# Patient Record
Sex: Female | Born: 2000
Health system: Southern US, Community
[De-identification: ages and names within clinical notes are randomized; demographics above are authoritative.]

## PROBLEM LIST (undated history)

## (undated) DIAGNOSIS — Z789 Other specified health status: Secondary | ICD-10-CM

## (undated) HISTORY — DX: Other specified health status: Z78.9

## (undated) HISTORY — PX: NO PAST SURGERIES: SHX2092

## (undated) HISTORY — PX: DILATION AND CURETTAGE OF UTERUS: SHX78

---
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
## 2021-04-12 ENCOUNTER — Other Ambulatory Visit: Payer: Self-pay | Admitting: Obstetrics and Gynecology

## 2021-04-12 ENCOUNTER — Encounter: Payer: Self-pay | Admitting: Obstetrics and Gynecology

## 2021-04-12 ENCOUNTER — Other Ambulatory Visit (HOSPITAL_COMMUNITY)
Admission: RE | Admit: 2021-04-12 | Discharge: 2021-04-12 | Disposition: A | Discharge status: Home / Self Care | Payer: BC Managed Care – PPO | Source: Ambulatory Visit | Attending: Obstetrics and Gynecology | Admitting: Obstetrics and Gynecology

## 2021-04-12 ENCOUNTER — Ambulatory Visit (INDEPENDENT_AMBULATORY_CARE_PROVIDER_SITE_OTHER): Payer: BC Managed Care – PPO | Admitting: Obstetrics and Gynecology

## 2021-04-12 ENCOUNTER — Other Ambulatory Visit: Payer: Self-pay

## 2021-04-12 VITALS — BP 128/82 | HR 79 | Ht 65.0 in | Wt 158.7 lb

## 2021-04-12 DIAGNOSIS — Z3A32 32 weeks gestation of pregnancy: Secondary | ICD-10-CM | POA: Diagnosis not present

## 2021-04-12 DIAGNOSIS — Z3202 Encounter for pregnancy test, result negative: Secondary | ICD-10-CM

## 2021-04-12 DIAGNOSIS — O0930 Supervision of pregnancy with insufficient antenatal care, unspecified trimester: Secondary | ICD-10-CM

## 2021-04-12 DIAGNOSIS — Z3403 Encounter for supervision of normal first pregnancy, third trimester: Secondary | ICD-10-CM

## 2021-04-12 DIAGNOSIS — O98813 Other maternal infectious and parasitic diseases complicating pregnancy, third trimester: Secondary | ICD-10-CM | POA: Insufficient documentation

## 2021-04-12 DIAGNOSIS — Z658 Other specified problems related to psychosocial circumstances: Secondary | ICD-10-CM | POA: Diagnosis not present

## 2021-04-12 DIAGNOSIS — O163 Unspecified maternal hypertension, third trimester: Secondary | ICD-10-CM

## 2021-04-12 DIAGNOSIS — O0990 Supervision of high risk pregnancy, unspecified, unspecified trimester: Secondary | ICD-10-CM | POA: Insufficient documentation

## 2021-04-12 DIAGNOSIS — Z3480 Encounter for supervision of other normal pregnancy, unspecified trimester: Secondary | ICD-10-CM | POA: Diagnosis not present

## 2021-04-12 DIAGNOSIS — Z3483 Encounter for supervision of other normal pregnancy, third trimester: Secondary | ICD-10-CM | POA: Diagnosis not present

## 2021-04-12 DIAGNOSIS — O09893 Supervision of other high risk pregnancies, third trimester: Secondary | ICD-10-CM

## 2021-04-12 DIAGNOSIS — Z23 Encounter for immunization: Secondary | ICD-10-CM

## 2021-04-12 LAB — POCT PREGNANCY, URINE: Preg Test, Ur: POSITIVE — AB

## 2021-04-12 MED ORDER — BLOOD PRESSURE KIT DEVI: 1.0000 | 0 refills | Status: DC

## 2021-04-12 MED ORDER — PRENATAL VITAMIN 27-0.8 MG PO TABS: 1.0000 | ORAL_TABLET | Freq: Every day | ORAL | 3 refills | Status: DC

## 2021-04-12 NOTE — Progress Notes (Signed)
Anatomy ultrasound has been scheduled for June 23, 22 at 2:30pm @ [facility]   Patient also declined food market and transportation services

## 2021-04-12 NOTE — Progress Notes (Signed)
Patient coplains of lower back pain

## 2021-04-12 NOTE — Progress Notes (Signed)
PRENATAL VISIT NOTE  Subjective:  Tammy Rosales is a 20 y.o. G1P0 at [redacted]w[redacted]d being seen today for initial prenatal visit.  She is currently monitored for the following issues for this low-risk pregnancy and has Supervision of other normal pregnancy, antepartum; Late prenatal care; Psychosocial stressors; Supervision of normal first teen pregnancy in third trimester; Elevated blood pressure affecting pregnancy in third trimester, antepartum; and [redacted] weeks gestation of pregnancy on their problem list.  Patient reports no complaints. However, pt reports feeling "scared about delivery". Contractions: Irritability. Vag. Bleeding: None.  Movement: Present. Denies leaking of fluid.   The following portions of the patient's history were reviewed and updated as appropriate: allergies, current medications, past family history, past medical history, past social history, past surgical history and problem list.   Objective:   Vitals:   04/12/21 1040 04/12/21 1043 04/12/21 1146  BP: 133/90  128/82  Pulse: 80  79  Weight: 158 lb 11.2 oz (72 kg)    Height:  5\' 5"  (1.651 m)     Fetal Status: Fetal Heart Rate (bpm): 135   Movement: Present     General:  Alert, oriented and cooperative. Patient is in no acute distress.  Skin: Skin is warm and dry. No rash noted.   Cardiovascular: Normal heart rate noted  Respiratory: Normal respiratory effort, no problems with respiration noted  Abdomen: Soft, gravid, appropriate for gestational age.  Pain/Pressure: Present     Pelvic: Cervical exam deferred        Extremities: Normal range of motion.  Edema: Trace  Mental Status: Normal mood and affect. Normal behavior. Normal judgment and thought content.   Assessment and Plan:  Pregnancy: G1P0 at [redacted]w[redacted]d by LMP. No prior ultrasounds performed.  1. Supervision of other normal pregnancy, antepartum  [redacted] weeks gestation of pregnancy:  Pt providing minimal responses to questions today. Denies PMH. Denies any current  medications or substance use. Denies history of HSV or other STIs. Denies mood and safety concerns but reports minimal social support ([name] not involved). Discussed plans for feeding, birth control and delivery; pt is currently undecided and had no questions today. Will plan for follow-up phone conversation given pt seems very overwhelmed today. - Tdap administered today - Hemoglobin A1c - Genetic Screening - [facility] AMB BABYSCRIPTS SCHEDULE OPTIMIZATION - [redacted]w[redacted]d MFM OB COMP + 14 WK; Future - CBC/D/Plt+RPR+Rh+ABO+RubIgG... - Cervicovaginal ancillary only( [facility]) - Glucose Tolerance, 2 Hours w/1 Hour; Future (plan to perform at f/u prenatal) - provided strict return precautions for preterm labor, leakage of fluid, decreased fetal movement, vaginal bleeding, other concerns - RTC in 1-2 weeks for Anatomy Scan & follow-up prenatal appt  2. Late prenatal care: First prenatal appointment today. No specific barriers to care reported. - UDS ordered but pt unable to provide urine sample  3. Psychosocial stressors: Pt reports minimal support. [name] not involved. Pt currently lives with her mother and states that her mother is aware of the pregnancy. However, she states that she has "no one" with whom she is able to talk with regarding her situation. Denies mood and safety concerns today. Denies SI, HI and thoughts of self harm. However, concern given that pt is very guarded with minimal engagement in conversation during visit today. - will plan to have extended conversation with pt via phone when calling with prenatal results [facility] referral today (pt informed of referral today)  4. Elevated blood pressure affecting pregnancy in third trimester, antepartum: Initial blood pressure elevated in clinic today, but  normal on repeat check. No concerning signs/symptoms per pt report. No prior diagnosis of hypertension in or outside of pregnancy. - preeclampsia labs ordered today (pt unable to provide enough urine for  UP:C) - provided strict return precautions for signs/symptoms of preeclampsia  Preterm labor symptoms and general obstetric precautions including but not limited to vaginal bleeding, contractions, leaking of fluid and fetal movement were reviewed in detail with the patient. Please refer to After Visit Summary for other counseling recommendations.   Return in about 1 week (around 04/19/2021) for f/u prenatal in 1-2 weeks with any provider in person. Needs gtt at f/u appt..  Future Appointments  Date Time Provider Department Center  04/18/2021  8:50 AM [facility] LAB [facility]  04/21/2021  1:20 PM Federico Flake, MD [facility]  04/21/2021  2:30 PM [facility] NURSE [facility]  04/21/2021  2:45 PM [facility] US6 [facility]   Barclay Lennox, Skipper Cliche, MD OB Fellow, Faculty Practice 04/12/2021 1:46 PM

## 2021-04-12 NOTE — Patient Instructions (Addendum)
Start prenatal vitamin daily

## 2021-04-13 LAB — CBC/D/PLT+RPR+RH+ABO+RUBIGG...
Antibody Screen: NEGATIVE
Basophils Absolute: 0 10*3/uL (ref 0.0–0.2)
Basos: 0 %
EOS (ABSOLUTE): 0 10*3/uL (ref 0.0–0.4)
Eos: 0 %
HCV Ab: <0.1 s/co ratio (ref 0.0–0.9)
HIV Screen 4th Generation wRfx: NONREACTIVE
Hematocrit: 27.1 % — ABNORMAL LOW (ref 34.0–46.6)
Hemoglobin: 7.5 g/dL — ABNORMAL LOW (ref 11.1–15.9)
Hepatitis B Surface Ag: NEGATIVE
Immature Grans (Abs): 0.1 10*3/uL (ref 0.0–0.1)
Immature Granulocytes: 1 %
Lymphocytes Absolute: 1.8 10*3/uL (ref 0.7–3.1)
Lymphs: 25 %
MCH: 16.8 pg — ABNORMAL LOW (ref 26.6–33.0)
MCHC: 27.7 g/dL — ABNORMAL LOW (ref 31.5–35.7)
MCV: 61 fL — ABNORMAL LOW (ref 79–97)
Monocytes Absolute: 0.4 10*3/uL (ref 0.1–0.9)
Monocytes: 6 %
Neutrophils Absolute: 4.8 10*3/uL (ref 1.4–7.0)
Neutrophils: 68 %
Platelets: 202 10*3/uL (ref 150–450)
RBC: 4.46 x10E6/uL (ref 3.77–5.28)
RDW: 22.5 % — ABNORMAL HIGH (ref 11.7–15.4)
RPR Ser Ql: NONREACTIVE
Rh Factor: POSITIVE
Rubella Antibodies, IGG: 12.1 index (ref >0.99)
WBC: 7.2 10*3/uL (ref 3.4–10.8)

## 2021-04-13 LAB — COMPREHENSIVE METABOLIC PANEL
ALT: 6 IU/L (ref 0–32)
AST: 13 IU/L (ref 0–40)
Albumin/Globulin Ratio: 1.3 (ref 1.2–2.2)
Albumin: 4.1 g/dL (ref 3.9–5.0)
Alkaline Phosphatase: 161 IU/L — ABNORMAL HIGH (ref 42–106)
BUN/Creatinine Ratio: 17 (ref 9–23)
BUN: 10 mg/dL (ref 6–20)
Bilirubin Total: 0.2 mg/dL (ref 0.0–1.2)
CO2: 19 mmol/L — ABNORMAL LOW (ref 20–29)
Calcium: 9.1 mg/dL (ref 8.7–10.2)
Chloride: 104 mmol/L (ref 96–106)
Creatinine, Ser: 0.58 mg/dL (ref 0.57–1.00)
Globulin, Total: 3.1 g/dL (ref 1.5–4.5)
Glucose: 75 mg/dL (ref 65–99)
Potassium: 3.8 mmol/L (ref 3.5–5.2)
Sodium: 139 mmol/L (ref 134–144)
Total Protein: 7.2 g/dL (ref 6.0–8.5)
eGFR: 134 mL/min/{1.73_m2} (ref >59)

## 2021-04-13 LAB — PROTEIN / CREATININE RATIO, URINE
Creatinine, Urine: 327.1 mg/dL
Protein, Ur: 155.2 mg/dL
Protein/Creat Ratio: 474 mg/g creat — ABNORMAL HIGH (ref 0–200)

## 2021-04-13 LAB — CERVICOVAGINAL ANCILLARY ONLY
Chlamydia: POSITIVE — AB
Comment: NEGATIVE
Comment: NEGATIVE
Comment: NORMAL
Neisseria Gonorrhea: NEGATIVE
Trichomonas: NEGATIVE

## 2021-04-13 LAB — HCV INTERPRETATION

## 2021-04-13 LAB — HEMOGLOBIN A1C
Est. average glucose Bld gHb Est-mCnc: 103 mg/dL
Hgb A1c MFr Bld: 5.2 % (ref 4.8–5.6)

## 2021-04-14 ENCOUNTER — Telehealth: Payer: Self-pay | Admitting: Obstetrics and Gynecology

## 2021-04-14 DIAGNOSIS — A749 Chlamydial infection, unspecified: Secondary | ICD-10-CM

## 2021-04-14 MED ORDER — AZITHROMYCIN 500 MG PO TABS: 1000.0000 mg | ORAL_TABLET | Freq: Once | ORAL | 0 refills | Status: AC

## 2021-04-14 NOTE — Telephone Encounter (Signed)
Attempted to call pt but unable to leave message given voice mailbox not set up. Sent MyChart message regarding +Chlamydia result. Sent script for azithromycin to preferred pharmacy. Instructed need for partner treatment prior to sexual activity to avoid reinfection.  Sheila Oats, MD OB Fellow, Faculty Practice 04/14/2021 4:49 PM

## 2021-04-15 ENCOUNTER — Encounter: Payer: Self-pay | Admitting: *Deleted

## 2021-04-18 ENCOUNTER — Encounter: Payer: Self-pay | Admitting: Obstetrics and Gynecology

## 2021-04-18 ENCOUNTER — Other Ambulatory Visit: Payer: Self-pay | Admitting: Obstetrics and Gynecology

## 2021-04-18 ENCOUNTER — Inpatient Hospital Stay (HOSPITAL_COMMUNITY)
Admission: AD | Admit: 2021-04-18 | Discharge: 2021-04-19 | Disposition: A | Discharge status: Home / Self Care | Payer: BC Managed Care – PPO | Attending: Obstetrics & Gynecology | Admitting: Obstetrics & Gynecology

## 2021-04-18 ENCOUNTER — Other Ambulatory Visit: Payer: BC Managed Care – PPO

## 2021-04-18 ENCOUNTER — Telehealth: Payer: Self-pay | Admitting: Obstetrics and Gynecology

## 2021-04-18 ENCOUNTER — Other Ambulatory Visit: Payer: Self-pay

## 2021-04-18 DIAGNOSIS — Z3A00 Weeks of gestation of pregnancy not specified: Secondary | ICD-10-CM | POA: Insufficient documentation

## 2021-04-18 DIAGNOSIS — O26899 Other specified pregnancy related conditions, unspecified trimester: Secondary | ICD-10-CM

## 2021-04-18 DIAGNOSIS — A749 Chlamydial infection, unspecified: Secondary | ICD-10-CM | POA: Insufficient documentation

## 2021-04-18 DIAGNOSIS — O99013 Anemia complicating pregnancy, third trimester: Secondary | ICD-10-CM | POA: Insufficient documentation

## 2021-04-18 DIAGNOSIS — R030 Elevated blood-pressure reading, without diagnosis of hypertension: Secondary | ICD-10-CM | POA: Insufficient documentation

## 2021-04-18 DIAGNOSIS — Z3689 Encounter for other specified antenatal screening: Secondary | ICD-10-CM

## 2021-04-18 DIAGNOSIS — O98313 Other infections with a predominantly sexual mode of transmission complicating pregnancy, third trimester: Secondary | ICD-10-CM

## 2021-04-18 DIAGNOSIS — R109 Unspecified abdominal pain: Secondary | ICD-10-CM | POA: Diagnosis not present

## 2021-04-18 DIAGNOSIS — O26893 Other specified pregnancy related conditions, third trimester: Secondary | ICD-10-CM | POA: Diagnosis not present

## 2021-04-18 DIAGNOSIS — Z3480 Encounter for supervision of other normal pregnancy, unspecified trimester: Secondary | ICD-10-CM

## 2021-04-18 DIAGNOSIS — R102 Pelvic and perineal pain: Secondary | ICD-10-CM | POA: Diagnosis not present

## 2021-04-18 DIAGNOSIS — O98813 Other maternal infectious and parasitic diseases complicating pregnancy, third trimester: Secondary | ICD-10-CM | POA: Diagnosis not present

## 2021-04-18 DIAGNOSIS — Z3A33 33 weeks gestation of pregnancy: Secondary | ICD-10-CM

## 2021-04-18 DIAGNOSIS — O1490 Unspecified pre-eclampsia, unspecified trimester: Secondary | ICD-10-CM | POA: Insufficient documentation

## 2021-04-18 MED ORDER — SODIUM CHLORIDE 0.9 % IV SOLN: INTRAVENOUS | Status: DC

## 2021-04-18 MED ORDER — SODIUM CHLORIDE 0.9 % IV SOLN
510.0000 mg | Freq: Once | INTRAVENOUS | Status: AC
  Administered 2021-04-19: 510 mg via INTRAVENOUS
  Filled 2021-04-18: qty 17

## 2021-04-18 NOTE — Telephone Encounter (Signed)
Called Tammy Rosales given new diagnosis of preeclampsia by UP:C 0.47. Tammy Rosales also with Hgb 7.5. When talking with Tammy Rosales she reported new vision changes, dizziness and new vaginal bleeding. Given inability to check blood pressure and concern of vaginal bleeding instructed Tammy Rosales to present to MAU tonight. Tammy Rosales also has been unable to pick up azithro for chlamydia infection diagnosed on 6/14. She is open to receiving IV iron in MAU tonight.  Sheila Oats, MD OB Fellow, Faculty Practice 04/18/2021 9:03 PM

## 2021-04-18 NOTE — Progress Notes (Signed)
IV iron order for anemia in pregnancy. Hgb 7.5 on 6/14.  Sheila Oats, MD OB Fellow, Faculty Practice 04/18/2021 8:38 PM

## 2021-04-18 NOTE — MAU Note (Addendum)
Pt reports she has been having lower back pain for 2 months. C/o creamy vaginal discharge and sharp pain in her lower abd for several  weeks. Pt also stated she saw some blood when she went  to the BR tonight. Went to first appointment last week.  And all they did was take her labs and blood pressure.  She said someone called her tonight from [facility] and told her to come in. ( Looking at note she was told she had possible preeclampsia and needed to come in to be evaluated. ) Pt reports mild headache tonight.

## 2021-04-19 ENCOUNTER — Telehealth: Payer: Self-pay

## 2021-04-19 DIAGNOSIS — O98813 Other maternal infectious and parasitic diseases complicating pregnancy, third trimester: Secondary | ICD-10-CM | POA: Diagnosis not present

## 2021-04-19 LAB — COMPREHENSIVE METABOLIC PANEL
ALT: 9 U/L (ref 0–44)
AST: 18 U/L (ref 15–41)
Albumin: 2.7 g/dL — ABNORMAL LOW (ref 3.5–5.0)
Alkaline Phosphatase: 131 U/L — ABNORMAL HIGH (ref 38–126)
Anion gap: 12 (ref 5–15)
BUN: 9 mg/dL (ref 6–20)
CO2: 19 mmol/L — ABNORMAL LOW (ref 22–32)
Calcium: 9 mg/dL (ref 8.9–10.3)
Chloride: 105 mmol/L (ref 98–111)
Creatinine, Ser: 0.65 mg/dL (ref 0.44–1.00)
GFR, Estimated: >60 mL/min (ref >60)
Glucose, Bld: 94 mg/dL (ref 70–99)
Potassium: 3.8 mmol/L (ref 3.5–5.1)
Sodium: 136 mmol/L (ref 135–145)
Total Bilirubin: 0.4 mg/dL (ref 0.3–1.2)
Total Protein: 6.1 g/dL — ABNORMAL LOW (ref 6.5–8.1)

## 2021-04-19 LAB — CBC
HCT: 24.1 % — ABNORMAL LOW (ref 36.0–46.0)
Hemoglobin: 6.8 g/dL — CL (ref 12.0–15.0)
MCH: 17.3 pg — ABNORMAL LOW (ref 26.0–34.0)
MCHC: 28.2 g/dL — ABNORMAL LOW (ref 30.0–36.0)
MCV: 61.2 fL — ABNORMAL LOW (ref 80.0–100.0)
Platelets: 217 10*3/uL (ref 150–400)
RBC: 3.94 MIL/uL (ref 3.87–5.11)
RDW: 23.2 % — ABNORMAL HIGH (ref 11.5–15.5)
WBC: 6.4 10*3/uL (ref 4.0–10.5)
nRBC: 0.5 % — ABNORMAL HIGH (ref 0.0–0.2)

## 2021-04-19 LAB — PROTEIN / CREATININE RATIO, URINE
Creatinine, Urine: 539.94 mg/dL
Protein Creatinine Ratio: 0.2 mg/mg{Cre} — ABNORMAL HIGH (ref 0.00–0.15)
Total Protein, Urine: 107 mg/dL

## 2021-04-19 MED ORDER — AZITHROMYCIN 250 MG PO TABS
1000.0000 mg | ORAL_TABLET | Freq: Once | ORAL | Status: AC
  Administered 2021-04-19: 1000 mg via ORAL
  Filled 2021-04-19: qty 4

## 2021-04-19 MED ORDER — ONDANSETRON 4 MG PO TBDP
8.0000 mg | ORAL_TABLET | Freq: Once | ORAL | Status: AC
  Administered 2021-04-19: 8 mg via ORAL
  Filled 2021-04-19: qty 2

## 2021-04-19 NOTE — MAU Provider Note (Signed)
Chief Complaint:  Back Pain   Event Date/Time   First Provider Initiated Contact with Patient 04/18/21 2347     HPI: Tammy Rosales is a 20 y.o. G1P0 at 72w1dwho presents to maternity admissions reporting overall concern about her pregnancy and various complaints including lower back pain, fatigue, vaginal spotting just this afternoon, and pain in her lower abdomen mostly when she moves or sneezes. Reports feeling dizzy a lot with a headache she could not describe well other than "I just feel irritated". Could not give headache a pain rated, said it did not feel like pressure. Also of note, pt has an appointment for GTT on Wednesday but thought she had to fast for 48hrs prior so has only had fries today. Reports not eating very much because she is not sure what can hurt the baby. No other physical complaints. MGM at bedside for support.  P[facility] due to pregnancy ambivalence, her parents found out just this past week. Prenatal records reviewed. Per Dr. GMary Sellanote, pt found to be preeclamptic (BP+PC:R) and when pt was called she reported dizziness and vision changes (no headache) and spotting so was sent to MAU for further evaluation. Pt also + for chlamydia but never began taking antibiotics.  No past medical history on file. OB History  Gravida Para Term Preterm AB Living  1            SAB IAB Ectopic Multiple Live Births               # Outcome Date GA Lbr Len/2nd Weight Sex Delivery Anes PTL Lv  1 Current            No past surgical history on file. No family history on file.   No Known Allergies Medications Prior to Admission  Medication Sig Dispense Refill Last Dose   Blood Pressure Monitoring (BLOOD PRESSURE KIT) DEVI 1 each by Does not apply route once a week. 1 each 0    Prenatal Vit-Fe Fumarate-FA (PRENATAL VITAMIN) 27-0.8 MG TABS Take 1 tablet by mouth daily. 90 tablet 3    I have reviewed patient's Past Medical Hx, Surgical Hx, Family  Hx, Social Hx, medications and allergies.   ROS:  Pertinent items noted in HPI and remainder of comprehensive ROS otherwise negative.  Physical Exam  Patient Vitals for the past 24 hrs:  BP Temp Pulse Resp SpO2 Height Weight  04/19/21 0101 129/75 -- 81 -- -- -- --  04/19/21 0032 125/72 -- 85 -- -- -- --  04/19/21 0025 128/73 -- 81 -- -- -- --  04/19/21 0021 -- -- -- -- 99 % -- --  04/19/21 0001 129/88 -- 86 -- 99 % -- --  04/18/21 2331 123/79 -- 78 -- 99 % -- --  04/18/21 2318 (!) 132/94 -- 89 -- -- -- --  04/18/21 2301 137/75 98.3 F (36.8 C) 100 18 -- 5' 5"  (1.651 m) 155 lb (70.3 kg)   Constitutional: Well-developed, well-nourished female in no acute distress but appears anxious.  Cardiovascular: normal rate & rhythm, no murmur Respiratory: normal effort, lung sounds clear throughout GI: Abd soft, non-tender, gravid appropriate for gestational age. Pos BS x 4 MS: Extremities nontender, no edema, normal ROM Neurologic: Alert and oriented x 4.  GU: no CVA tenderness Pelvic: NEFG, copious white milky discharge, no blood but cervix friable, visually closed  Fetal Tracing: reactive Baseline: 140  Variability: moderate Accelerations: 15x15 Decelerations: none Toco: relaxed  Labs: Results for orders placed or performed during the hospital encounter of 04/18/21 (from the past 24 hour(s))  CBC     Status: Abnormal   Collection Time: 04/18/21 11:48 PM  Result Value Ref Range   WBC 6.4 4.0 - 10.5 K/uL   RBC 3.94 3.87 - 5.11 MIL/uL   Hemoglobin 6.8 (LL) 12.0 - 15.0 g/dL   HCT 24.1 (L) 36.0 - 46.0 %   MCV 61.2 (L) 80.0 - 100.0 fL   MCH 17.3 (L) 26.0 - 34.0 pg   MCHC 28.2 (L) 30.0 - 36.0 g/dL   RDW 23.2 (H) 11.5 - 15.5 %   Platelets 217 150 - 400 K/uL   nRBC 0.5 (H) 0.0 - 0.2 %  Comprehensive metabolic panel     Status: Abnormal   Collection Time: 04/18/21 11:48 PM  Result Value Ref Range   Sodium 136 135 - 145 mmol/L   Potassium 3.8 3.5 - 5.1 mmol/L   Chloride 105 98 - 111  mmol/L   CO2 19 (L) 22 - 32 mmol/L   Glucose, Bld 94 70 - 99 mg/dL   BUN 9 6 - 20 mg/dL   Creatinine, Ser 0.65 0.44 - 1.00 mg/dL   Calcium 9.0 8.9 - 10.3 mg/dL   Total Protein 6.1 (L) 6.5 - 8.1 g/dL   Albumin 2.7 (L) 3.5 - 5.0 g/dL   AST 18 15 - 41 U/L   ALT 9 0 - 44 U/L   Alkaline Phosphatase 131 (H) 38 - 126 U/L   Total Bilirubin 0.4 0.3 - 1.2 mg/dL   GFR, Estimated >60 >60 mL/min   Anion gap 12 5 - 15  Protein / creatinine ratio, urine     Status: Abnormal   Collection Time: 04/18/21 11:48 PM  Result Value Ref Range   Creatinine, Urine 539.94 mg/dL   Total Protein, Urine 107 mg/dL   Protein Creatinine Ratio 0.20 (H) 0.00 - 0.15 mg/mg[Cre]   Imaging:  No results found.  MAU Course: Orders Placed This Encounter  Procedures   CBC   Comprehensive metabolic panel   Protein / creatinine ratio, urine   Discharge patient   Meds ordered this encounter  Medications   ferumoxytol (FERAHEME) 510 mg in sodium chloride 0.9 % 100 mL IVPB   0.9 %  sodium chloride infusion   azithromycin (ZITHROMAX) tablet 1,000 mg   ondansetron (ZOFRAN-ODT) disintegrating tablet 8 mg   MDM: Clear barriers to care noted so treatment for chlamydia and critically low iron given in MAU. Premedicated with zofran and given azithromycin, feraheme ordered/given and well-tolerated.  BP and labs improved, never severe range or requiring of labetalol protocol. PC:R improved from last test.  Lengthy discussion with pt and MGM about importance of nutrition and hydration for well-being of both she and baby. Encouraged her to start eating regularly and whatever she can tolerate. Emphasized that the fasting for GTT starts at midnight the night before the test as we do not recommend fasting for any longer than 8-12 hrs while pregnant. Copious reassurance given, pt visibly relieved and more talkative by end of the visit.  Assessment: 1. Supervision of other normal pregnancy, antepartum   2. Elevated blood pressure  reading without diagnosis of hypertension   3. NST (non-stress test) reactive   4. Chlamydia infection complicating pregnancy in third trimester   5. Abdominal cramping affecting pregnancy, antepartum    Plan: Discharge home in stable condition with preterrm/preeclampsia precautions given    Follow-up Information  [facility] [employer] [facility]. Go to.   Specialty: Obstetrics and Gynecology Why: 6/22 at 8:50am for GDM 6/23 at 1:20pm with Dr. Lubertha Sayres, ultrasound will be right after your appointment Contact information: [address] [place] [phone]                Allergies as of 04/19/2021   No Known Allergies      Medication List     TAKE these medications    Blood Pressure Kit Devi 1 each by Does not apply route once a week.   Prenatal Vitamin 27-0.8 MG Tabs Take 1 tablet by mouth daily.       Gaylan Gerold, CNM, MSN, [place] Certified Nurse Midwife, [facility]

## 2021-04-19 NOTE — Telephone Encounter (Addendum)
-----   Message from Sheila Oats, MD sent at 04/18/2021  8:38 PM EDT ----- Regarding: Need for outpatient IV iron appt (fereheme order placed) Please call and schedule IV iron infusion for outpatient setting. I have ordered fereheme and called the pt to inform her of Hgb 7.5. Thanks!   Called short stay to schedule iron infusion appointment 3 times today; unable to reach department. Option to leave a VM; did not leave message. Will call again tomorrow.

## 2021-04-19 NOTE — MAU Note (Signed)
CRITICAL VALUE STICKER  CRITICAL VALUE:Hgb 6.8  RECEIVER (on-site recipient of call):Kathy WilsonRN  DATE & TIME NOTIFIED: 04/19/21 0010  MESSENGER (representative from lab):  MD NOTIFIED: Jamella Walker,CNM  TIME OF NOTIFICATION:0010  RESPONSE:  IV iron already orderd

## 2021-04-20 ENCOUNTER — Other Ambulatory Visit: Payer: BC Managed Care – PPO

## 2021-04-20 ENCOUNTER — Other Ambulatory Visit: Payer: Self-pay

## 2021-04-20 DIAGNOSIS — Z3480 Encounter for supervision of other normal pregnancy, unspecified trimester: Secondary | ICD-10-CM | POA: Diagnosis not present

## 2021-04-21 ENCOUNTER — Encounter: Payer: Self-pay | Admitting: Family Medicine

## 2021-04-21 ENCOUNTER — Encounter: Payer: Self-pay | Admitting: *Deleted

## 2021-04-21 ENCOUNTER — Ambulatory Visit: Payer: BC Managed Care – PPO | Admitting: *Deleted

## 2021-04-21 ENCOUNTER — Ambulatory Visit (INDEPENDENT_AMBULATORY_CARE_PROVIDER_SITE_OTHER): Payer: BC Managed Care – PPO | Admitting: Family Medicine

## 2021-04-21 ENCOUNTER — Ambulatory Visit: Payer: BC Managed Care – PPO | Attending: Obstetrics and Gynecology

## 2021-04-21 VITALS — BP 126/69 | HR 70

## 2021-04-21 VITALS — BP 130/69 | HR 73 | Wt 154.8 lb

## 2021-04-21 DIAGNOSIS — O0930 Supervision of pregnancy with insufficient antenatal care, unspecified trimester: Secondary | ICD-10-CM | POA: Insufficient documentation

## 2021-04-21 DIAGNOSIS — Z658 Other specified problems related to psychosocial circumstances: Secondary | ICD-10-CM

## 2021-04-21 DIAGNOSIS — O0990 Supervision of high risk pregnancy, unspecified, unspecified trimester: Secondary | ICD-10-CM

## 2021-04-21 DIAGNOSIS — Z3480 Encounter for supervision of other normal pregnancy, unspecified trimester: Secondary | ICD-10-CM | POA: Diagnosis not present

## 2021-04-21 DIAGNOSIS — O1493 Unspecified pre-eclampsia, third trimester: Secondary | ICD-10-CM

## 2021-04-21 DIAGNOSIS — K5900 Constipation, unspecified: Secondary | ICD-10-CM

## 2021-04-21 DIAGNOSIS — O99013 Anemia complicating pregnancy, third trimester: Secondary | ICD-10-CM

## 2021-04-21 LAB — GLUCOSE TOLERANCE, 2 HOURS W/ 1HR
Glucose, 1 hour: 75 mg/dL (ref 65–179)
Glucose, 2 hour: 93 mg/dL (ref 65–152)
Glucose, Fasting: 69 mg/dL (ref 65–91)

## 2021-04-21 MED ORDER — POLYETHYLENE GLYCOL 3350 17 GM/SCOOP PO POWD: 1.0000 | Freq: Two times a day (BID) | ORAL | 1 refills | Status: DC

## 2021-04-21 NOTE — Patient Instructions (Signed)
Blood Builder ([employer])-- this is to increase you iron and prevent anemia  Get at whole food, online  Start Miralax constipation

## 2021-04-21 NOTE — Progress Notes (Signed)
Pt states a lot of back pain when standing, has not been taking any Meds. Advised ok to take Tylenol for pain. Pt verbalized understanding.

## 2021-04-21 NOTE — Progress Notes (Signed)
   PRENATAL VISIT NOTE  Subjective:  Tammy Rosales is a 20 y.o. G1P0 at [redacted]w[redacted]d being seen today for ongoing prenatal care.  She is currently monitored for the following issues for this high-risk pregnancy and has Supervision of other normal pregnancy, antepartum; Late prenatal care; Psychosocial stressors; Elevated blood pressure affecting pregnancy in third trimester, antepartum; [redacted] weeks gestation of pregnancy; Preeclampsia; and Anemia in pregnancy, third trimester on their problem list.  Patient reports backache- located on the left with change in position. Patient reports she has not tried tylenol.  Contractions: Not present. Vag. Bleeding: None.  Movement: Present. Denies leaking of fluid.   The following portions of the patient's history were reviewed and updated as appropriate: allergies, current medications, past family history, past medical history, past social history, past surgical history and problem list.   Objective:   Vitals:   04/21/21 1331  BP: 130/69  Pulse: 73  Weight: 154 lb 12.8 oz (70.2 kg)    Fetal Status: Fetal Heart Rate (bpm): 159   Movement: Present     General:  Alert, oriented and cooperative. Patient is in no acute distress.  Skin: Skin is warm and dry. No rash noted.   Cardiovascular: Normal heart rate noted  Respiratory: Normal respiratory effort, no problems with respiration noted  Abdomen: Soft, gravid, appropriate for gestational age.  Pain/Pressure: Present     Pelvic: Cervical exam deferred        Extremities: Normal range of motion.  Edema: Trace  Mental Status: Normal mood and affect. Normal behavior. Normal judgment and thought content.   Assessment and Plan:  Pregnancy: G1P0 at [redacted]w[redacted]d 1. Anemia in pregnancy, third trimester Hgb 6.8 -- received Fe infusion in MAU Needs additional and has one scheduled 6.28 Recheck in 3 weeks  2. Supervision of other normal pregnancy, antepartum Will get [place] today for anatomy scan CT was positive 6/14,  treated and will need TOC in early July Discussed constipation at length, has hard/pebble stools maybe every 2 days  - Miralax 1 cap BID - increase water/fluids  3. Late prenatal care  4. Preeclampsia - BPP today with MFM - weekly BPP for PEC - Discussed delivery at 37 weeks given dx - Recommend weekly labs   Preterm labor symptoms and general obstetric precautions including but not limited to vaginal bleeding, contractions, leaking of fluid and fetal movement were reviewed in detail with the patient. Please refer to After Visit Summary for other counseling recommendations.   Return in about 2 weeks (around 05/05/2021) for Routine prenatal care, in person- with Edd Arbour or Karyl Kinnier please.  Future Appointments  Date Time Provider Department Center  04/21/2021  2:30 PM [facility] NURSE [facility]  04/21/2021  2:45 PM [facility] US6 [facility]  04/26/2021  1:00 PM [facility] None    Federico Flake, MD

## 2021-04-21 NOTE — Telephone Encounter (Signed)
I called and scheduled for fereheme for 04/26/21 at 1:00. I called [name] and informed her of need for another fereheme infusion and date of appointment. She voices understanding. I also sent message to Dr. Barb Merino to clarify if needs 1 or 2 infusions since she had 1 in MAU already on 04/18/21. Cottrell Gentles,RN

## 2021-04-22 ENCOUNTER — Other Ambulatory Visit: Payer: Self-pay | Admitting: *Deleted

## 2021-04-22 DIAGNOSIS — O133 Gestational [pregnancy-induced] hypertension without significant proteinuria, third trimester: Secondary | ICD-10-CM

## 2021-04-26 ENCOUNTER — Inpatient Hospital Stay (HOSPITAL_COMMUNITY): Admission: RE | Admit: 2021-04-26 | Payer: BC Managed Care – PPO | Source: Ambulatory Visit

## 2021-04-27 ENCOUNTER — Encounter: Payer: Self-pay | Admitting: *Deleted

## 2021-04-28 ENCOUNTER — Other Ambulatory Visit: Payer: Self-pay

## 2021-04-28 ENCOUNTER — Inpatient Hospital Stay (HOSPITAL_COMMUNITY)
Admission: AD | Admit: 2021-04-28 | Discharge: 2021-04-28 | Disposition: A | Discharge status: Home / Self Care | Payer: BC Managed Care – PPO | Attending: Obstetrics & Gynecology | Admitting: Obstetrics & Gynecology

## 2021-04-28 ENCOUNTER — Ambulatory Visit: Payer: BC Managed Care – PPO | Admitting: *Deleted

## 2021-04-28 ENCOUNTER — Encounter (HOSPITAL_COMMUNITY): Payer: Self-pay | Admitting: Obstetrics & Gynecology

## 2021-04-28 ENCOUNTER — Ambulatory Visit: Payer: Self-pay

## 2021-04-28 DIAGNOSIS — O1205 Gestational edema, complicating the puerperium: Secondary | ICD-10-CM | POA: Insufficient documentation

## 2021-04-28 DIAGNOSIS — O99893 Other specified diseases and conditions complicating puerperium: Secondary | ICD-10-CM | POA: Diagnosis not present

## 2021-04-28 DIAGNOSIS — R600 Localized edema: Secondary | ICD-10-CM

## 2021-04-28 DIAGNOSIS — K5900 Constipation, unspecified: Secondary | ICD-10-CM

## 2021-04-28 DIAGNOSIS — O9963 Diseases of the digestive system complicating the puerperium: Secondary | ICD-10-CM | POA: Diagnosis not present

## 2021-04-28 DIAGNOSIS — O1493 Unspecified pre-eclampsia, third trimester: Secondary | ICD-10-CM

## 2021-04-28 DIAGNOSIS — M545 Low back pain, unspecified: Secondary | ICD-10-CM | POA: Diagnosis not present

## 2021-04-28 DIAGNOSIS — F411 Generalized anxiety disorder: Secondary | ICD-10-CM

## 2021-04-28 MED ORDER — CYCLOBENZAPRINE HCL 5 MG PO TABS: 5.0000 mg | ORAL_TABLET | Freq: Two times a day (BID) | ORAL | 0 refills | Status: DC | PRN

## 2021-04-28 MED ORDER — POLYETHYLENE GLYCOL 3350 17 GM/SCOOP PO POWD: 17.0000 g | Freq: Every day | ORAL | 1 refills | Status: DC | PRN

## 2021-04-28 MED ORDER — FUROSEMIDE 20 MG PO TABS: 20.0000 mg | ORAL_TABLET | Freq: Every day | ORAL | 0 refills | Status: DC

## 2021-04-28 NOTE — MAU Note (Signed)
Presents with c/o back pain, constipation, and swelling.  States had vaginal delivery on June 26th @ [facility].  States received [facility] at Center for [employer] but was out of town when labor began.

## 2021-04-28 NOTE — MAU Provider Note (Signed)
History     [id number]  Arrival date and time: 04/28/21 1500    Chief Complaint  Patient presents with   Constipation   Back Pain     HPI Tammy Rosales is a 20 y.o. s/p NSVD on 04/24/2021 who presents for multiple issues.   Patient reports NSVD five days prior while she was out of town for a funeral at [facility] in [place], [place] Reportedly had rapid labor w no time for epidural, vaginal delivery c/b PPH  Patient interviewed with her mother who is also at bedside and her baby Reports she presented for several issues Her legs have been swollen since delivery Both sides equally swollen No personal or family hx of blood clots She is not a smoker Also notes bilateral low back pain, not radiating Also notes constipation, has not had BM in 2 days, has not taken anything for it      OB History     Gravida  1   Para  1   Term  1   Preterm  0   AB      Living  1      SAB      IAB      Ectopic      Multiple      Live Births  1           History reviewed. No pertinent past medical history.  Past Surgical History:  Procedure Laterality Date   DILATION AND CURETTAGE OF UTERUS      History reviewed. No pertinent family history.  Social History   Socioeconomic History   Marital status: Single    Spouse name: Not on file   Number of children: Not on file   Years of education: Not on file   Highest education level: Not on file  Occupational History   Not on file  Tobacco Use   Smoking status: Never   Smokeless tobacco: Never  Substance and Sexual Activity   Alcohol use: Not Currently   Drug use: Not Currently   Sexual activity: Not Currently  Other Topics Concern   Not on file  Social History Narrative   Not on file   Social Determinants of Health   Financial Resource Strain: Not on file  Food Insecurity: Not on file  Transportation Needs: Not on file  Physical Activity: Not on file  Stress: Not on file  Social  Connections: Not on file  Intimate Partner Violence: Not on file    Not on File  No current facility-administered medications on file prior to encounter.   No current outpatient medications on file prior to encounter.     ROS Pertinent positives and negative per HPI, all others reviewed and negative  Physical Exam   BP 129/83 (BP Location: Right Arm)   Pulse 79   Temp 98.1 F (36.7 C) (Oral)   Resp 20   Ht 5\' 5"  (1.651 m)   Wt 66.6 kg   SpO2 100%   BMI 24.43 kg/m   Patient Vitals for the past 24 hrs:  BP Temp Temp src Pulse Resp SpO2 Height Weight  04/28/21 1657 129/83 98.1 F (36.7 C) Oral 79 20 100 % -- --  04/28/21 1646 -- -- -- -- -- -- 5\' 5"  (1.651 m) 66.6 kg    Physical Exam Vitals reviewed.  Constitutional:      General: She is not in acute distress.    Appearance: She is well-developed. She is not diaphoretic.  Eyes:     General: No scleral icterus. Pulmonary:     Effort: Pulmonary effort is normal. No respiratory distress.  Abdominal:     General: There is no distension.     Palpations: Abdomen is soft.     Tenderness: There is no abdominal tenderness. There is no guarding or rebound.  Musculoskeletal:     Comments: Mild non pitting bilateral LE edema in ankles  Skin:    General: Skin is warm and dry.  Neurological:     Mental Status: She is alert.     Coordination: Coordination normal.     Cervical Exam    Bedside Ultrasound Not done  My interpretation: n/a  FHT N/a  Labs No results found for this or any previous visit (from the past 24 hour(s)).  Imaging No results found.  MAU Course  Procedures Lab Orders  No laboratory test(s) ordered today   Meds ordered this encounter  Medications   furosemide (LASIX) 20 MG tablet    Sig: Take 1 tablet (20 mg total) by mouth daily for 3 days.    Dispense:  3 tablet    Refill:  0   cyclobenzaprine (FLEXERIL) 5 MG tablet    Sig: Take 1 tablet (5 mg total) by mouth 2 (two) times daily  as needed for muscle spasms.    Dispense:  20 tablet    Refill:  0   polyethylene glycol powder (GLYCOLAX/MIRALAX) 17 GM/SCOOP powder    Sig: Take 17 g by mouth daily as needed.    Dispense:  510 g    Refill:  1   Imaging Orders  No imaging studies ordered today    MDM moderate  Assessment and Plan  #LE edema Normal PP edema. Nothing on history or exam concerning for DVT. Offered short course of lasix which she accepts.   #Low back pain No red flags. Likely secondary to new activities of motherhood. Trial flexeril.  #Constipation  Offered enema, declines. Counseled on how to use miralax, rx sent to her pharmacy.   #Scant prenatal care Message sent to [facility] to schedule PP visit. Not currently on birth control, referred to bedsider and encouraged her to explore a method prior to upcoming visit.   Discharged to home in stable condition.  Venora Maples, MD/MPH 04/28/21 8:12 PM  Allergies as of 04/28/2021   Not on File      Medication List     TAKE these medications    cyclobenzaprine 5 MG tablet Commonly known as: FLEXERIL Take 1 tablet (5 mg total) by mouth 2 (two) times daily as needed for muscle spasms.   furosemide 20 MG tablet Commonly known as: LASIX Take 1 tablet (20 mg total) by mouth daily for 3 days.   polyethylene glycol powder 17 GM/SCOOP powder Commonly known as: GLYCOLAX/MIRALAX Take 17 g by mouth daily as needed.

## 2021-04-29 ENCOUNTER — Encounter: Payer: Self-pay | Admitting: *Deleted

## 2021-04-29 NOTE — Progress Notes (Signed)
Pt presented for scheduled NST/BPP appt on 6/30. Upon entering exam room, pt stated that she had delivered her baby on 6/26 @ [facility]. When pt was asked shy she came in for appt today, she became tearful and stated that she didn't know what to do and how to proceed with her care. She said that "things have been so messed up" during her pregnancy such as getting prenatal care late and then having the baby out of town when she was there for her grandmother's funeral. I provided emotional support and explained post partum care. Throughout our conversation regarding next steps in her healthcare, pt continued to be tearful. I offered [facility] appt and she agreed - will schedule upon check-out. Pt will also schedule PP check up appt. Pt voiced understanding of all information discussed.

## 2021-05-03 ENCOUNTER — Ambulatory Visit (INDEPENDENT_AMBULATORY_CARE_PROVIDER_SITE_OTHER): Payer: BC Managed Care – PPO | Admitting: Clinical

## 2021-05-03 DIAGNOSIS — F4321 Adjustment disorder with depressed mood: Secondary | ICD-10-CM

## 2021-05-03 DIAGNOSIS — F4329 Adjustment disorder with other symptoms: Secondary | ICD-10-CM | POA: Diagnosis not present

## 2021-05-03 NOTE — BH Specialist Note (Signed)
Integrated Behavioral Health via Telemedicine Visit  05/03/2021 Willodean Leven [id number]  Number of Integrated Behavioral Health visits: 1 Session Start time: 9:15  Session End time: 9:35 Total time: 20  Referring Provider: Edd Arbour, CNM Patient/Family location: Home [facility] Provider location: Center for [facility] at [facility]  All persons participating in visit: Patient Linzie Collin and [facility] Ruwayda Curet   Types of Service: Individual psychotherapy and Video visit  I connected with Irving Burton and/or [name] Serena's  n/a  via  Telephone or Video Enabled Telemedicine Application  (Video is Caregility application) and verified that I am speaking with the correct person using two identifiers. Discussed confidentiality: Yes   I discussed the limitations of telemedicine and the availability of in person appointments.  Discussed there is a possibility of technology failure and discussed alternative modes of communication if that failure occurs.  I discussed that engaging in this telemedicine visit, they consent to the provision of behavioral healthcare and the services will be billed under their insurance.  Patient and/or legal guardian expressed understanding and consented to Telemedicine visit: Yes   Presenting Concerns: Patient and/or family reports the following symptoms/concerns: Pt states primary concern today "not able to think", exhaustion postpartum, poor appetite,  lack of quality sleep, mourning loss of great grandmother while adjusting to new motherhood; family supportive at home.  Duration of problem: postpartum; Severity of problem:  undetermined  Patient and/or Family's Strengths/Protective Factors: Social connections and [place] supports in place (healthy food, safe environments, etc.)  Goals Addressed: Patient will:  Reduce symptoms of: anxiety, depression, and stress   Increase knowledge and/or ability of: healthy habits    Demonstrate ability to: Increase healthy adjustment to current life circumstances, Increase adequate support systems for patient/family, and Improve medication compliance  Progress towards Goals: Ongoing  Interventions: Interventions utilized:  Psychoeducation and/or Health Education and Link to [employer] Standardized Assessments completed:  Not given today  Patient and/or Family Response: Pt agrees with treatment plan  Assessment: Patient currently experiencing Adjustment disorder with other symptoms and Grief  Patient may benefit from psychoeducation and brief therapeutic interventions regarding coping with symptoms of grief and adjustment  .  Plan: Follow up with behavioral health clinician on : Two weeks Behavioral recommendations:  -Begin taking prenatal vitamins, as prescribed by medical provider -Continue to sleep when baby sleeps; accept help from family to be able to get needed rest -Consider new mom support groups online at [email or web address] (Mom Talk, breastfeeding support) Referral(s): Integrated Art gallery manager (In Clinic) and [employer] Resources:  New mom support  I discussed the assessment and treatment plan with the patient and/or parent/guardian. They were provided an opportunity to ask questions and all were answered. They agreed with the plan and demonstrated an understanding of the instructions.   They were advised to call back or seek an in-person evaluation if the symptoms worsen or if the condition fails to improve as anticipated.  Rae Lips, LCSW  Depression screen [facility] 2/9 04/21/2021 04/12/2021  Decreased Interest 0 3  Down, Depressed, Hopeless 0 3  PHQ - 2 Score 0 6  Altered sleeping 2 3  Tired, decreased energy 0 3  Change in appetite 3 3  Feeling bad or failure about yourself  0 0  Trouble concentrating 0 0  Moving slowly or fidgety/restless 0 0  Suicidal thoughts 0 0  PHQ-9 Score 5 15   GAD 7 : Generalized Anxiety  Score 04/21/2021 04/12/2021  Nervous, Anxious, on Edge 0 3  Control/stop worrying 0 3  Worry too much - different things 0 3  Trouble relaxing 0 3  Restless 0 3  Easily annoyed or irritable 0 3  Afraid - awful might happen 0 3  Total GAD 7 Score 0 21

## 2021-05-03 NOTE — Patient Instructions (Signed)
Center for [facility] Healthcare at [facility] [address] [place], [place] [address] [phone] (main office) [phone] [facility] office)  [email or web address] (new mom support at Mom Talk, breastfeeding support online groups)

## 2021-05-04 NOTE — BH Specialist Note (Signed)
Pt did not arrive to video visit and did not answer the phone; unable to leave message as voicemail is not set up; left MyChart message for patient.

## 2021-05-09 ENCOUNTER — Encounter: Payer: BC Managed Care – PPO | Admitting: Medical

## 2021-05-09 ENCOUNTER — Other Ambulatory Visit: Payer: BC Managed Care – PPO

## 2021-05-10 ENCOUNTER — Encounter: Payer: BC Managed Care – PPO | Admitting: Family Medicine

## 2021-05-12 ENCOUNTER — Encounter: Payer: Self-pay | Admitting: *Deleted

## 2021-05-13 ENCOUNTER — Ambulatory Visit: Payer: BC Managed Care – PPO

## 2021-05-17 ENCOUNTER — Ambulatory Visit: Payer: Medicaid Other | Admitting: Clinical

## 2021-05-17 DIAGNOSIS — Z91199 Patient's noncompliance with other medical treatment and regimen due to unspecified reason: Secondary | ICD-10-CM

## 2021-05-17 DIAGNOSIS — Z5329 Procedure and treatment not carried out because of patient's decision for other reasons: Secondary | ICD-10-CM

## 2021-05-19 DIAGNOSIS — D563 Thalassemia minor: Secondary | ICD-10-CM | POA: Insufficient documentation

## 2021-06-06 ENCOUNTER — Other Ambulatory Visit: Payer: Self-pay

## 2021-06-06 ENCOUNTER — Encounter: Payer: Self-pay | Admitting: Obstetrics and Gynecology

## 2021-06-06 ENCOUNTER — Ambulatory Visit (INDEPENDENT_AMBULATORY_CARE_PROVIDER_SITE_OTHER): Payer: BC Managed Care – PPO | Admitting: Obstetrics and Gynecology

## 2021-06-06 VITALS — BP 109/67 | HR 70 | Ht 65.0 in | Wt 137.6 lb

## 2021-06-06 DIAGNOSIS — Z309 Encounter for contraceptive management, unspecified: Secondary | ICD-10-CM | POA: Insufficient documentation

## 2021-06-06 DIAGNOSIS — Z8751 Personal history of pre-term labor: Secondary | ICD-10-CM | POA: Insufficient documentation

## 2021-06-06 DIAGNOSIS — Z30011 Encounter for initial prescription of contraceptive pills: Secondary | ICD-10-CM

## 2021-06-06 DIAGNOSIS — F4329 Adjustment disorder with other symptoms: Secondary | ICD-10-CM | POA: Diagnosis not present

## 2021-06-06 DIAGNOSIS — F4321 Adjustment disorder with depressed mood: Secondary | ICD-10-CM | POA: Diagnosis not present

## 2021-06-06 HISTORY — DX: Personal history of pre-term labor: Z87.51

## 2021-06-06 MED ORDER — NORETHINDRONE 0.35 MG PO TABS: 1.0000 | ORAL_TABLET | Freq: Every day | ORAL | 11 refills | Status: DC

## 2021-06-06 NOTE — Progress Notes (Signed)
    Post Partum Visit Note  Tammy Rosales is a 20 y.o. G12P1001 female who presents for a postpartum visit. She is 6 weeks postpartum following a normal spontaneous vaginal delivery.  I have fully reviewed the prenatal and intrapartum course. The delivery was at 33.6 gestational weeks.  Anesthesia: none. Postpartum course has been unremarkable. Baby is doing well. Baby is feeding by breast. Bleeding staining only. Bowel function is normal. Bladder function is normal. Patient is not sexually active. Contraception method is oral progesterone-only contraceptive. Postpartum depression screening: positive.   The pregnancy intention screening data noted above was reviewed. Potential methods of contraception were discussed. The patient elected to proceed with No data recorded.    Health Maintenance Due  Topic Date Due   COVID-19 Vaccine (1) Never done   HPV VACCINES (1 - 2-dose series) Never done   INFLUENZA VACCINE  05/30/2021    Medical record   Review of Systems Pertinent items noted in HPI and remainder of comprehensive ROS otherwise negative.  Objective:  LMP 08/30/2020 (Approximate)    General:  alert   Breasts:  N/A  Lungs: clear to auscultation bilaterally  Heart:  regular rate and rhythm, S1, S2 normal, no murmur, click, rub or gallop  Abdomen: soft, non-tender; bowel sounds normal; no masses,  no organomegaly   Wound NA  GU exam:  not indicated       Assessment:    There are no diagnoses linked to this encounter.  Nl postpartum exam.  Elevated [place] depression  Plan:   Essential components of care per ACOG recommendations:  1.  Mood and well being: Patient with positive depression screening today. Reviewed local resources for support. Referred to [facility] - Patient tobacco use? No.   - hx of drug use? No.    2. Infant care and feeding:  -Patient currently breastmilk feeding? Yes, no problems.  -Social determinants of health (SDOH) reviewed in EPIC. No  concerns  3. Sexuality, contraception and birth spacing - Patient does not want a pregnancy in the next year.  Desired family size is uncertain. c - Reviewed forms of contraception in tiered fashion. Patient desired oral progesterone-only contraceptive today.   - Discussed birth spacing of 18 months  4. Sleep and fatigue -Encouraged family/partner/community support of 4 hrs of uninterrupted sleep to help with mood and fatigue  5. Physical Recovery  - Discussed patients delivery and complications. She describes her labor as good. - Patient had a Vaginal, no problems at delivery. Patient had a  no  laceration. Perineal healing reviewed. Patient expressed understanding - Patient has urinary incontinence? No. - Patient is safe to resume physical and sexual activity  6.  Health Maintenance - HM due items addressed Yes - Last pap smear No results found for: DIAGPAP Pap smear not done at today's visit.  -Breast Cancer screening indicated? No.   7. Chronic Disease/Pregnancy Condition follow up: None  - PCP follow up  Nettie Elm, MD Center for [facility], [facility]

## 2021-06-06 NOTE — Patient Instructions (Signed)
Health Maintenance, Female Adopting a healthy lifestyle and getting preventive care are important in promoting health and wellness. Ask your health care provider about: The right schedule for you to have regular tests and exams. Things you can do on your own to prevent diseases and keep yourself healthy. What should I know about diet, weight, and exercise? Eat a healthy diet  Eat a diet that includes plenty of vegetables, fruits, low-fat dairy products, and lean protein. Do not eat a lot of foods that are high in solid fats, added sugars, or sodium.  Maintain a healthy weight Body mass index (BMI) is used to identify weight problems. It estimates body fat based on height and weight. Your health care provider can help determineyour BMI and help you achieve or maintain a healthy weight. Get regular exercise Get regular exercise. This is one of the most important things you can do for your health. Most adults should: Exercise for at least 150 minutes each week. The exercise should increase your heart rate and make you sweat (moderate-intensity exercise). Do strengthening exercises at least twice a week. This is in addition to the moderate-intensity exercise. Spend less time sitting. Even light physical activity can be beneficial. Watch cholesterol and blood lipids Have your blood tested for lipids and cholesterol at 20 years of age, then havethis test every 5 years. Have your cholesterol levels checked more often if: Your lipid or cholesterol levels are high. You are older than 20 years of age. You are at high risk for heart disease. What should I know about cancer screening? Depending on your health history and family history, you may need to have cancer screening at various ages. This may include screening for: Breast cancer. Cervical cancer. Colorectal cancer. Skin cancer. Lung cancer. What should I know about heart disease, diabetes, and high blood pressure? Blood pressure and heart  disease High blood pressure causes heart disease and increases the risk of stroke. This is more likely to develop in people who have high blood pressure readings, are of African descent, or are overweight. Have your blood pressure checked: Every 3-5 years if you are 18-39 years of age. Every year if you are 40 years old or older. Diabetes Have regular diabetes screenings. This checks your fasting blood sugar level. Have the screening done: Once every three years after age 40 if you are at a normal weight and have a low risk for diabetes. More often and at a younger age if you are overweight or have a high risk for diabetes. What should I know about preventing infection? Hepatitis B If you have a higher risk for hepatitis B, you should be screened for this virus. Talk with your health care provider to find out if you are at risk forhepatitis B infection. Hepatitis C Testing is recommended for: Everyone born from 1945 through 1965. Anyone with known risk factors for hepatitis C. Sexually transmitted infections (STIs) Get screened for STIs, including gonorrhea and chlamydia, if: You are sexually active and are younger than 20 years of age. You are older than 20 years of age and your health care provider tells you that you are at risk for this type of infection. Your sexual activity has changed since you were last screened, and you are at increased risk for chlamydia or gonorrhea. Ask your health care provider if you are at risk. Ask your health care provider about whether you are at high risk for HIV. Your health care provider may recommend a prescription medicine to help   prevent HIV infection. If you choose to take medicine to prevent HIV, you should first get tested for HIV. You should then be tested every 3 months for as long as you are taking the medicine. Pregnancy If you are about to stop having your period (premenopausal) and you may become pregnant, seek counseling before you get  pregnant. Take 400 to 800 micrograms (mcg) of folic acid every day if you become pregnant. Ask for birth control (contraception) if you want to prevent pregnancy. Osteoporosis and menopause Osteoporosis is a disease in which the bones lose minerals and strength with aging. This can result in bone fractures. If you are 65 years old or older, or if you are at risk for osteoporosis and fractures, ask your health care provider if you should: Be screened for bone loss. Take a calcium or vitamin D supplement to lower your risk of fractures. Be given hormone replacement therapy (HRT) to treat symptoms of menopause. Follow these instructions at home: Lifestyle Do not use any products that contain nicotine or tobacco, such as cigarettes, e-cigarettes, and chewing tobacco. If you need help quitting, ask your health care provider. Do not use street drugs. Do not share needles. Ask your health care provider for help if you need support or information about quitting drugs. Alcohol use Do not drink alcohol if: Your health care provider tells you not to drink. You are pregnant, may be pregnant, or are planning to become pregnant. If you drink alcohol: Limit how much you use to 0-1 drink a day. Limit intake if you are breastfeeding. Be aware of how much alcohol is in your drink. In the [place] one drink equals one 12 oz bottle of beer (355 mL), one 5 oz glass of wine (148 mL), or one 1 oz glass of hard liquor (44 mL). General instructions Schedule regular health, dental, and eye exams. Stay current with your vaccines. Tell your health care provider if: You often feel depressed. You have ever been abused or do not feel safe at home. Summary Adopting a healthy lifestyle and getting preventive care are important in promoting health and wellness. Follow your health care provider's instructions about healthy diet, exercising, and getting tested or screened for diseases. Follow your health care provider's  instructions on monitoring your cholesterol and blood pressure. This information is not intended to replace advice given to you by your health care provider. Make sure you discuss any questions you have with your healthcare provider. Document Revised: 10/09/2018 Document Reviewed: 10/09/2018 Elsevier Patient Education  2022 Elsevier Inc.  

## 2021-10-31 ENCOUNTER — Ambulatory Visit (HOSPITAL_COMMUNITY)
Admission: EM | Admit: 2021-10-31 | Discharge: 2021-10-31 | Disposition: A | Discharge status: Home / Self Care | Payer: BC Managed Care – PPO

## 2021-10-31 DIAGNOSIS — F4320 Adjustment disorder, unspecified: Secondary | ICD-10-CM | POA: Diagnosis not present

## 2021-10-31 NOTE — Discharge Instructions (Signed)

## 2021-10-31 NOTE — Progress Notes (Signed)
°   10/31/21 1120  [place] (Walk-ins at [facility] only)  How Did You Hear About [place]? Family/Friend  What Is the Reason for Your Visit/Call Today? Patient presents with mother for assessment.  Patient did not want to present today,howver states "I just got in the car."  She denies any concerns, outside of sleep issues.  She preferred that her mother not come back for the triage assessment.  She also expressed that she would prefer we not speak with her mother about her concerns.  Patient denies SI, HI, AVH or SA concerns. Patient feels she is "okay" and does not need support/treatment at this time.  She is aware of resources, should he need to seek treatment at some point.  How Long Has This Been Causing You Problems? 1-6 months  Have You Recently Had Any Thoughts About Hurting Yourself? No  Are You Planning to Commit Suicide/Harm Yourself At This time? No  Have you Recently Had Thoughts About [place]? No  Are You Planning To Harm Someone At This Time? No  Are you currently experiencing any auditory, visual or other hallucinations? No  Have You Used Any Alcohol or Drugs in the Past 24 Hours? No  Do you have any current medical co-morbidities that require immediate attention? No  Clinician description of patient physical appearance/behavior: Patient presents with flat affect, minimally engaged and clearly does not want to be be here.  What Do You Feel Would Help You the Most Today? Treatment for Depression or other mood problem  If access to [facility] was not available, would you have sought care in the Emergency Department? No  Determination of Need Routine (7 days)  Options For Referral Outpatient Therapy

## 2021-10-31 NOTE — Progress Notes (Signed)
AVS reviewed and given to patient by Provider.  All questions answered.  Patient discharged in stable condition accompanied by mother.  No acute distress noted.

## 2021-10-31 NOTE — ED Provider Notes (Signed)
Behavioral Health Urgent Care Medical Screening Exam  Patient Name: Tammy Rosales MRN: [id number] Date of Evaluation: 10/31/21 Chief Complaint:   Diagnosis:  Final diagnoses:  Adjustment disorder, unspecified type    History of Present illness: Tammy Rosales is a 21 y.o. female patient presented to [facility] as a walk in accompanied by her mother with complaints of  "My mom wanted me to come so I just got in the car". Patient refused for mother to be present during the assessment.  She also refused for this writer to obtain collateral from mother. She lives in the home with her mother and 87 mo old child.  Tammy Rosales, 20 y.o., female patient seen face to face by this provider, consulted with Dr. Serafina Mitchell; and chart reviewed on 10/31/21.  Per chart review patient has seen LCSW at [facility] through her OB-GYN service. She gave birth around 04/28/2021. She had two appointments with this services and was a no show to her last appointment on 06/06/2021. She was diagnosed with Adjustment disorder and from notes was positive on her PHQ9 and GAD 7. Reports she does not take any medications. Patient has no immediate health concerns.  During evaluation Tammy Rosales is in sitting position in no acute distress. She makes fair eye contact. Speech is clear, coherent, normal rate and tone. She is guarded and acts irritated with questions. She denies depression and anxiety. She has a euthymic mood. Denies any concerns with appetite. Sates she is only sleeping roughly 5 hours per night.  Her thought process is coherent and relevant; There is no indication that she is currently responding to internal/external stimuli or experiencing delusional thought content. She denies suicidal/self-harm/homicidal ideation, psychosis, and paranoia.  She contracts for safety. She adamantly denies any thoughts of wanting to hurt her self or her baby. Denies access to firearms/weapons.Discussed post partum depression  and the challenges with motherhood. She deny's any concerns.   Patients mother remained in the lobby and requested to speak about her concerns about the patient. Educated mother due to HIPA information could not be shared. Mother expressed her concerns that patient has no motivation. States she is taking care of the patient and her 107 mo old. She does not identify any safety concerns with patient returning home.   At this time Tammy Rosales is educated and verbalizes understanding of mental health resources and other crisis services in the community. She is instructed to call 911 and present to the nearest emergency room should she experience any suicidal/homicidal ideation, auditory/visual/hallucinations, or detrimental worsening of his mental health condition.  She was a also advised by Probation officer that she could call the toll-free phone on insurance card to assist with identifying in network counselors and agencies or number on back of Medicaid card to speak with care coordinator.     Psychiatric Specialty Exam  Presentation  General Appearance:Appropriate for Environment; Casual  Eye Contact:Fair  Speech:Clear and Coherent; Normal Rate  Speech Volume:Normal  Handedness:Right   Mood and Affect  Mood:Euthymic  Affect:Congruent   Thought Process  Thought Processes:Coherent  Descriptions of Associations:Intact  Orientation:Full (Time, Place and Person)  Thought Content:Logical    Hallucinations:None  Ideas of Reference:None  Suicidal Thoughts:No  Homicidal Thoughts:No   Sensorium  Memory:Immediate Good; Recent Good; Remote Good  Judgment:Good  Insight:Good   Executive Functions  Concentration:Good  Attention Span:Good  [place]  Language:Good   Psychomotor Activity  Psychomotor Activity:Normal   Assets  Assets:Communication Skills; Desire for Improvement; Financial  Resources/Insurance; Housing; Physical Health; Social  Support   Sleep  Sleep:Fair  Number of hours: 5   No data recorded  Physical Exam: Physical Exam Vitals and nursing note reviewed.  Constitutional:      General: She is not in acute distress.    Appearance: Normal appearance. She is not ill-appearing.  HENT:     Head: Normocephalic.  Eyes:     General:        Right eye: No discharge.        Left eye: No discharge.     Conjunctiva/sclera: Conjunctivae normal.  Cardiovascular:     Rate and Rhythm: Normal rate.  Pulmonary:     Effort: Pulmonary effort is normal.  Musculoskeletal:        General: Normal range of motion.     Cervical back: Normal range of motion.  Skin:    Coloration: Skin is not jaundiced or pale.  Neurological:     Mental Status: She is alert and oriented to person, place, and time.  Psychiatric:        Attention and Perception: Attention and perception normal.        Mood and Affect: Mood and affect normal.        Speech: Speech normal.        Behavior: Behavior normal. Behavior is cooperative.        Thought Content: Thought content normal.        Cognition and Memory: Cognition normal.        Judgment: Judgment normal.   Review of Systems  Constitutional: Negative.   HENT: Negative.    Eyes: Negative.   Respiratory: Negative.    Cardiovascular: Negative.   Musculoskeletal: Negative.   Skin: Negative.   Neurological: Negative.   Psychiatric/Behavioral: Negative.    Blood pressure 107/69, pulse 75, temperature 98.4 F (36.9 C), temperature source Oral, resp. rate 16, SpO2 100 %, currently breastfeeding. There is no height or weight on file to calculate BMI.  Musculoskeletal: Strength & Muscle Tone: within normal limits Gait & Station: normal Patient leans: N/A   [place] [facility] Discharge Disposition for Follow up and Recommendations: Based on my evaluation the patient does not appear to have an emergency medical condition and can be discharged with resources and follow up care in outpatient  services for Medication Management and Individual Therapy.  Discharge patient.   Provided resources for Behavioral health out patient services on the second floor including open access walk in hours.  Provided Therapeutic Interventions for mobile crisis.  No evidence of imminent risk to self or others at present.    Patient does not meet criteria for psychiatric inpatient admission. Discussed crisis plan, support from social network, calling 911, coming to the Emergency Department, and calling Suicide Hotline.    Revonda Humphrey, NP 10/31/2021, 12:09 PM

## 2021-11-11 ENCOUNTER — Telehealth (HOSPITAL_COMMUNITY): Payer: Self-pay

## 2021-11-11 NOTE — BH Assessment (Signed)
Care Management - Follow Up Discharges   Writer attempted to make contact with patient today and was unsuccessful.  Writer left a HIPPA compliant voice message.   Per chart review, patient was provided with outpatient resources at [facility] on the 2nd floor for Open Access.

## 2021-12-06 ENCOUNTER — Other Ambulatory Visit: Payer: Self-pay

## 2021-12-06 NOTE — Progress Notes (Signed)
New Patient Office Visit  Subjective:  Patient ID: Tammy Rosales, female    DOB: 05/31/2001  Age: 20 y.o. MRN: [id number]  CC:  Chief Complaint  Patient presents with   Establish Care    Np. Est care. Requesting CPE.     HPI Tammy Rosales presents for new patient visit to establish care.  Introduced to Publishing rights manager role and practice setting.  All questions answered.  Discussed provider/patient relationship and expectations.  She has no concerns today and would like a physical.   Depression and Anxiety Screen done today and shows:  GAD 7 : Generalized Anxiety Score 12/07/2021 04/21/2021 04/12/2021  Nervous, Anxious, on Edge 2 0 3  Control/stop worrying 2 0 3  Worry too much - different things 2 0 3  Trouble relaxing 2 0 3  Restless 2 0 3  Easily annoyed or irritable 2 0 3  Afraid - awful might happen 2 0 3  Total GAD 7 Score 14 0 21  Anxiety Difficulty Not difficult at all - -    Depression screen [facility] 2/9 12/07/2021 04/21/2021 04/12/2021  Decreased Interest 2 0 3  Down, Depressed, Hopeless 2 0 3  PHQ - 2 Score 4 0 6  Altered sleeping 2 2 3   Tired, decreased energy 2 0 3  Change in appetite 2 3 3   Feeling bad or failure about yourself  2 0 0  Trouble concentrating 2 0 0  Moving slowly or fidgety/restless 2 0 0  Suicidal thoughts 0 0 0  PHQ-9 Score 16 5 15   Difficult doing work/chores Not difficult at all - -    Past Medical History:  Diagnosis Date   History of preterm labor 06/06/2021   SVD 03/2021 at 33 weeks   Medical history non-contributory     Past Surgical History:  Procedure Laterality Date   DILATION AND CURETTAGE OF UTERUS     NO PAST SURGERIES      Family History  Problem Relation Age of Onset   Hypertension Maternal Aunt     Social History   Socioeconomic History   Marital status: Single    Spouse name: Not on file   Number of children: Not on file   Years of education: Not on file   Highest education level: Not on file  Occupational  History   Not on file  Tobacco Use   Smoking status: Never   Smokeless tobacco: Never  Vaping Use   Vaping Use: Never used  Substance and Sexual Activity   Alcohol use: Not Currently   Drug use: Not Currently   Sexual activity: Not Currently  Other Topics Concern   Not on file  Social History Narrative   Not on file   Social Determinants of Health   Financial Resource Strain: Not on file  Food Insecurity: Food Insecurity Present   Worried About Running Out of Food in the Last Year: Sometimes true   Ran Out of Food in the Last Year: Sometimes true  Transportation Needs: Unmet Transportation Needs   Lack of Transportation (Medical): Yes   Lack of Transportation (Non-Medical): Yes  Physical Activity: Not on file  Stress: Not on file  Social Connections: Not on file  Intimate Partner Violence: Not on file    ROS Review of Systems  Constitutional: Negative.   HENT: Negative.    Eyes: Negative.   Respiratory: Negative.    Cardiovascular: Negative.   Gastrointestinal: Negative.   Genitourinary: Negative.   Musculoskeletal: Negative.   Skin:  Negative.   Neurological:  Positive for headaches. Negative for dizziness.  Psychiatric/Behavioral: Negative.     Objective:   Today's Vitals: BP 96/78    Pulse 73    Temp (!) 96.5 F (35.8 C) (Temporal)    Ht 5\' 4"  (1.626 m)    Wt 132 lb (59.9 kg)    LMP 11/30/2021 (Approximate)    SpO2 98%    Breastfeeding No    BMI 22.66 kg/m   Physical Exam Vitals and nursing note reviewed.  Constitutional:      General: She is not in acute distress.    Appearance: Normal appearance.  HENT:     Head: Normocephalic and atraumatic.     Right Ear: Tympanic membrane, ear canal and external ear normal.     Left Ear: Tympanic membrane, ear canal and external ear normal.     Nose: Nose normal.     Mouth/Throat:     Mouth: Mucous membranes are moist.     Pharynx: Oropharynx is clear.  Eyes:     Conjunctiva/sclera: Conjunctivae normal.   Cardiovascular:     Rate and Rhythm: Normal rate and regular rhythm.     Pulses: Normal pulses.     Heart sounds: Normal heart sounds.  Pulmonary:     Effort: Pulmonary effort is normal.     Breath sounds: Normal breath sounds.  Abdominal:     General: Bowel sounds are normal.     Palpations: Abdomen is soft.     Tenderness: There is no abdominal tenderness.  Musculoskeletal:        General: Normal range of motion.     Cervical back: Normal range of motion.  Skin:    General: Skin is warm and dry.  Neurological:     General: No focal deficit present.     Mental Status: She is alert and oriented to person, place, and time.     Cranial Nerves: No cranial nerve deficit.     Coordination: Coordination normal.     Gait: Gait normal.  Psychiatric:        Mood and Affect: Mood normal. Affect is flat.        Behavior: Behavior normal.        Thought Content: Thought content normal.        Judgment: Judgment normal.    Assessment & Plan:   Problem List Items Addressed This Visit       Other   Anxiety and depression    PHQ 9 and GAD 7 scores very elevated today, although she said that it is not causing her any difficulty in her daily life. She denies SI/HI. Affect flat during visit. She can journal, exercise, meditate, listen to music, or other ways to help her mood at home. Follow up with any concerns or worsening of symptoms.       Birth control counseling    She is currently not using any contraception and declines starting any today. Follow up if she wants to start contraception.       Other Visit Diagnoses     Routine general medical examination at a health care facility    -  Primary   Health maintenance reviewed and updated. Check CMP and CBC today.    Relevant Orders   CBC with Differential/Platelet   Comprehensive metabolic panel      LABORATORY TESTING:  - Pap smear: not applicable  IMMUNIZATIONS:   - Tdap: Tetanus vaccination status reviewed: last tetanus  booster within 10  years. - Influenza: Declined - Pneumovax: Not applicable - Prevnar: Not applicable - HPV: Declined today, may be interested in future - Zostavax vaccine: Not applicable  SCREENING: -Mammogram: Not applicable  - Colonoscopy: Not applicable  - Bone Density: Not applicable  -Hearing Test: Not applicable  -Spirometry: Not applicable   PATIENT COUNSELING:   Advised to take 1 mg of folate supplement per day if capable of pregnancy.   Sexuality: Discussed sexually transmitted diseases, partner selection, use of condoms, avoidance of unintended pregnancy  and contraceptive alternatives.   Advised to avoid cigarette smoking.  I discussed with the patient that most people either abstain from alcohol or drink within safe limits (<=14/week and <=4 drinks/occasion for males, <=7/weeks and <= 3 drinks/occasion for females) and that the risk for alcohol disorders and other health effects rises proportionally with the number of drinks per week and how often a drinker exceeds daily limits.  Discussed cessation/primary prevention of drug use and availability of treatment for abuse.   Diet: Encouraged to adjust caloric intake to maintain  or achieve ideal body weight, to reduce intake of dietary saturated fat and total fat, to limit sodium intake by avoiding high sodium foods and not adding table salt, and to maintain adequate dietary potassium and calcium preferably from fresh fruits, vegetables, and low-fat dairy products.    stressed the importance of regular exercise  Injury prevention: Discussed safety belts, safety helmets, smoke detector, smoking near bedding or upholstery.   Dental health: Discussed importance of regular tooth brushing, flossing, and dental visits.    NEXT PREVENTATIVE PHYSICAL DUE IN 1 YEAR.  Outpatient Encounter Medications as of 12/07/2021  Medication Sig   [DISCONTINUED] norethindrone (MICRONOR) 0.35 MG tablet Take 1 tablet (0.35 mg total) by mouth  daily.   [DISCONTINUED] Prenatal Vit-Fe Fumarate-FA (PRENATAL VITAMIN) 27-0.8 MG TABS Take 1 tablet by mouth daily.   No facility-administered encounter medications on file as of 12/07/2021.    Follow-up: Return in about 1 year (around 12/07/2022) for CPE.   Gerre Scull, NP

## 2021-12-07 ENCOUNTER — Ambulatory Visit (INDEPENDENT_AMBULATORY_CARE_PROVIDER_SITE_OTHER): Payer: BC Managed Care – PPO | Admitting: Nurse Practitioner

## 2021-12-07 ENCOUNTER — Encounter: Payer: Self-pay | Admitting: Nurse Practitioner

## 2021-12-07 VITALS — BP 96/78 | HR 73 | Temp 96.5°F | Ht 64.0 in | Wt 132.0 lb

## 2021-12-07 DIAGNOSIS — F419 Anxiety disorder, unspecified: Secondary | ICD-10-CM | POA: Diagnosis not present

## 2021-12-07 DIAGNOSIS — Z3009 Encounter for other general counseling and advice on contraception: Secondary | ICD-10-CM

## 2021-12-07 DIAGNOSIS — D72820 Lymphocytosis (symptomatic): Secondary | ICD-10-CM | POA: Diagnosis not present

## 2021-12-07 DIAGNOSIS — Z0000 Encounter for general adult medical examination without abnormal findings: Secondary | ICD-10-CM | POA: Diagnosis not present

## 2021-12-07 DIAGNOSIS — F32A Depression, unspecified: Secondary | ICD-10-CM

## 2021-12-07 LAB — CBC WITH DIFFERENTIAL/PLATELET
Basophils Absolute: 0 10*3/uL (ref 0.0–0.1)
Basophils Relative: 0.8 % (ref 0.0–3.0)
Eosinophils Absolute: 0.1 10*3/uL (ref 0.0–0.7)
Eosinophils Relative: 2.7 % (ref 0.0–5.0)
HCT: 36.8 % (ref 36.0–46.0)
Hemoglobin: 11.8 g/dL — ABNORMAL LOW (ref 12.0–15.0)
Lymphocytes Relative: 62.7 % — ABNORMAL HIGH (ref 12.0–46.0)
Lymphs Abs: 2.5 10*3/uL (ref 0.7–4.0)
MCHC: 32.2 g/dL (ref 30.0–36.0)
MCV: 78.9 fl (ref 78.0–100.0)
Monocytes Absolute: 0.3 10*3/uL (ref 0.1–1.0)
Monocytes Relative: 7.3 % (ref 3.0–12.0)
Neutro Abs: 1.1 10*3/uL — ABNORMAL LOW (ref 1.4–7.7)
Neutrophils Relative %: 26.5 % — ABNORMAL LOW (ref 43.0–77.0)
Platelets: 226 10*3/uL (ref 150.0–400.0)
RBC: 4.66 Mil/uL (ref 3.87–5.11)
RDW: 15.5 % — ABNORMAL HIGH (ref 11.5–14.6)
WBC: 4 10*3/uL — ABNORMAL LOW (ref 4.5–10.5)

## 2021-12-07 NOTE — Assessment & Plan Note (Signed)
She is currently not using any contraception and declines starting any today. Follow up if she wants to start contraception.  °

## 2021-12-07 NOTE — Patient Instructions (Signed)
It was great to see you!  We are updated your labs and HPV vaccine today. You will need a second HPV vaccine in 2 months and the 3rd one in 6 months.  Let's follow-up in 1 year, sooner if you have concerns.  If a referral was placed today, you will be contacted for an appointment. Please note that routine referrals can sometimes take up to 3-4 weeks to process. Please call our office if you haven't heard anything after this time frame.  Take care,  Vance Peper, NP

## 2021-12-07 NOTE — Assessment & Plan Note (Addendum)
PHQ 9 and GAD 7 scores very elevated today, although she said that it is not causing her any difficulty in her daily life. She denies SI/HI. Affect flat during visit. She can journal, exercise, meditate, listen to music, or other ways to help her mood at home. Follow up with any concerns or worsening of symptoms.

## 2021-12-07 NOTE — Assessment & Plan Note (Deleted)
She is currently not using any contraception and declines starting any today. Follow up if she wants to start contraception.

## 2021-12-08 LAB — COMPREHENSIVE METABOLIC PANEL
ALT: 10 U/L (ref 0–35)
AST: 15 U/L (ref 0–37)
Albumin: 3.9 g/dL (ref 3.5–5.2)
Alkaline Phosphatase: 80 U/L (ref 39–117)
BUN: 11 mg/dL (ref 6–23)
CO2: 30 mEq/L (ref 19–32)
Calcium: 9.2 mg/dL (ref 8.4–10.5)
Chloride: 106 mEq/L (ref 96–112)
Creatinine, Ser: 0.66 mg/dL (ref 0.40–1.20)
GFR: 126.18 mL/min (ref >60.00)
Glucose, Bld: 106 mg/dL — ABNORMAL HIGH (ref 70–99)
Potassium: 3.6 mEq/L (ref 3.5–5.1)
Sodium: 141 mEq/L (ref 135–145)
Total Bilirubin: 0.2 mg/dL (ref 0.2–1.2)
Total Protein: 7.1 g/dL (ref 6.0–8.3)

## 2021-12-08 NOTE — Addendum Note (Signed)
Addended by: Rodman Pickle A on: 12/08/2021 12:46 PM   Modules accepted: Orders

## 2021-12-09 NOTE — Progress Notes (Signed)
Called and informed patient of results and provider instructions. Patient voiced understanding. Scheduled pat lab appt 2/14 @920am 

## 2021-12-13 ENCOUNTER — Other Ambulatory Visit: Payer: BC Managed Care – PPO

## 2021-12-14 ENCOUNTER — Other Ambulatory Visit (INDEPENDENT_AMBULATORY_CARE_PROVIDER_SITE_OTHER): Payer: BC Managed Care – PPO

## 2021-12-14 ENCOUNTER — Other Ambulatory Visit: Payer: Self-pay

## 2021-12-14 DIAGNOSIS — D72820 Lymphocytosis (symptomatic): Secondary | ICD-10-CM

## 2021-12-14 LAB — CBC WITH DIFFERENTIAL/PLATELET
Basophils Absolute: 0 10*3/uL (ref 0.0–0.1)
Basophils Relative: 0.7 % (ref 0.0–3.0)
Eosinophils Absolute: 0.2 10*3/uL (ref 0.0–0.7)
Eosinophils Relative: 2.7 % (ref 0.0–5.0)
HCT: 37.2 % (ref 36.0–46.0)
Hemoglobin: 11.8 g/dL — ABNORMAL LOW (ref 12.0–15.0)
Lymphocytes Relative: 54.8 % — ABNORMAL HIGH (ref 12.0–46.0)
Lymphs Abs: 3.1 10*3/uL (ref 0.7–4.0)
MCHC: 31.7 g/dL (ref 30.0–36.0)
MCV: 78.9 fl (ref 78.0–100.0)
Monocytes Absolute: 0.6 10*3/uL (ref 0.1–1.0)
Monocytes Relative: 9.8 % (ref 3.0–12.0)
Neutro Abs: 1.8 10*3/uL (ref 1.4–7.7)
Neutrophils Relative %: 32 % — ABNORMAL LOW (ref 43.0–77.0)
Platelets: 245 10*3/uL (ref 150.0–400.0)
RBC: 4.72 Mil/uL (ref 3.87–5.11)
RDW: 15 % — ABNORMAL HIGH (ref 11.5–14.6)
WBC: 5.7 10*3/uL (ref 4.5–10.5)

## 2021-12-14 NOTE — Progress Notes (Signed)
Per the orders of  Lauren McElwee NP, pt is here for labs, pt tolerated draw well. ? ? ?

## 2021-12-15 LAB — PATHOLOGIST SMEAR REVIEW

## 2021-12-20 ENCOUNTER — Ambulatory Visit (HOSPITAL_COMMUNITY)
Admission: EM | Admit: 2021-12-20 | Discharge: 2021-12-20 | Disposition: A | Discharge status: Home / Self Care | Payer: BC Managed Care – PPO

## 2021-12-20 DIAGNOSIS — F331 Major depressive disorder, recurrent, moderate: Secondary | ICD-10-CM

## 2021-12-20 DIAGNOSIS — F4321 Adjustment disorder with depressed mood: Secondary | ICD-10-CM

## 2021-12-20 NOTE — Progress Notes (Signed)
°   12/20/21 1148  [facility] Triage Screening (Walk-ins at [facility] only)  How Did You Hear About [place]? Family/Friend  What Is the Reason for Your Visit/Call Today? Patient presents with her mother for assessment due to concerns for worsening depression, possibly post partum depression.  Symptoms began approximately 3 mos after baby was born.  Patient is disengaged and denies all concerns /symptoms.  She allowed her mother to stay for assessment.  Mother states patient is sleeping all day, and only wakes up to eat.  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  Patient will not answer safety questions, will only state, "I'm fine." She denies HI, AVH or SA hx.   Patient's mother is concerned that patient's symptoms have worsened.  She shares that patient has covered vents and outlets in her room, however doesn't give any reasons for this.  She has also been listening to loud white noise, rather than music through out the day.  Patient left this morning with her 21 month old baby and was gone for a while before her 21 y.o. brother called mother to report them missing.  Patient has turned her phone service off, so there is no way to reach her when she leaves unannounced.  Patient was minimally engaged throughout evaluation with provider as well, mostly responding with "no" or "I'm fine."  Patient's mother continued to share concerns, however based on patient's current presentation, she is not meeting IVC criteria.  Mother informed of the process to initiate IVC petition with magistrate, which she is considering at this point.  Patient has been discharged with instructions to return, should she decide to engage in evaluation/treatment.  How Long Has This Been Causing You Problems? 1-6 months  Have You Recently Had Any Thoughts About Hurting Yourself? No  Are You Planning to Commit Suicide/Harm Yourself At This time? No  Have you Recently Had Thoughts About Hurting Someone  Karolee Ohs? No  Are You Planning To Harm Someone At This Time? No  Are you currently experiencing any auditory, visual or other hallucinations? No  Have You Used Any Alcohol or Drugs in the Past 24 Hours? No  Do you have any current medical co-morbidities that require immediate attention? No  Clinician description of patient physical appearance/behavior: Patient presents with flat affect, minimally engaged.  She did allow mother to stay for triage.  Ultimately she refuses treatment.  What Do You Feel Would Help You the Most Today? Treatment for Depression or other mood problem  If access to [facility] was not available, would you have sought care in the Emergency Department? No  Determination of Need Routine (7 days)  Options For Referral Medication Management;Outpatient Therapy

## 2021-12-20 NOTE — Discharge Instructions (Signed)
Take all medications as prescribed. Keep all follow-up appointments as scheduled.  Do not consume alcohol or use illegal drugs while on prescription medications. Report any adverse effects from your medications to your primary care provider promptly.  In the event of recurrent symptoms or worsening symptoms, call 911, a crisis hotline, or go to the nearest emergency department for evaluation.   

## 2021-12-20 NOTE — ED Provider Notes (Signed)
Behavioral Health Urgent Care Medical Screening Exam  Patient Name: Romeesa Schickel MRN: [id number] Date of Evaluation: 12/20/21 Chief Complaint:   Diagnosis:  Final diagnoses:  Moderate episode of recurrent major depressive disorder ([place])    History of Present illness: [name] Stokely is a 21 y.o. female.present to [facility] [place] accompanied by her mother who reports " something is wrong with her mentally."  Stated that she got off work this morning and Bobbye Riggs- Bryson Ha was not in the home called the police department later found that she left with her 53-month-old child, and they were found on the back porch.  Reports ever since the birth of her child patient's mental health has declined. She reported patient is listening to white noise loudly throughout the day. Denies that she is followed by therapy or psychiatry.  Denies that she is taking any psychotropic medications currently.  She recently  [name] observed sitting in chair slightly irritable.  Responses are short and abrupt. Shape " yes or no."  Denying suicidal or homicidal ideations.  Denies auditory visual hallucinations.  Denies depression or depressive symptoms.  Reported history with marijuana use denies any other illicit substance abuse currently. Mother reported family history with schizophrenia paternal grandmother.   During evaluation [name] Loveall is sitting in no acute distress. She is alert/oriented x 4; calm/cooperative; and mood congruent with affect. She is speaking in a clear tone at moderate volume, and normal pace; with good eye contact. Her thought process is coherent and relevant; There is no indication that she is currently responding to internal/external stimuli or experiencing delusional thought content; and she has denied suicidal/self-harm/homicidal ideation, psychosis, and paranoia. Patient has remained calm and irritable  throughout assessment and has answered questions appropriately.     At this  time Xinyue Sang is educated and verbalizes understanding of mental health resources and other crisis services in the community. She is instructed to call 911 and present to the nearest emergency room should she experience any suicidal/homicidal ideation, auditory/visual/hallucinations, or detrimental worsening of her mental health condition. She was a also advised by Probation officer that she could call the toll-free phone on insurance card to assist with identifying in network counselors and agencies or number on back of Medicaid card to speak with care coordinator      Psychiatric Specialty Exam  Presentation  General Appearance:Appropriate for Environment; Casual  Eye Contact:Fair  Speech:Clear and Coherent; Normal Rate  Speech Volume:Normal  Handedness:Right   Mood and Affect  Mood:Euthymic  Affect:Congruent   Thought Process  Thought Processes:Coherent  Descriptions of Associations:Intact  Orientation:Full (Time, Place and Person)  Thought Content:Logical    Hallucinations:None  Ideas of Reference:None  Suicidal Thoughts:No  Homicidal Thoughts:No   Sensorium  Memory:Immediate Good; Recent Good; Remote Good  Judgment:Good  Insight:Good   Executive Functions  Concentration:Good  Attention Span:Good  [place]  Language:Good   Psychomotor Activity  Psychomotor Activity:Normal   Assets  Assets:Communication Skills; Desire for Improvement; Financial Resources/Insurance; Housing; Physical Health; Social Support   Sleep  Sleep:Fair  Number of hours: 5   No data recorded  Physical Exam: Physical Exam Vitals and nursing note reviewed.  Cardiovascular:     Rate and Rhythm: Normal rate.     Pulses: Normal pulses.  Neurological:     Mental Status: She is oriented to person, place, and time.  Psychiatric:        Mood and Affect: Mood normal.        Thought Content: Thought  content normal.   Review of Systems  Eyes:  Negative.   Respiratory: Negative.    Cardiovascular: Negative.   Psychiatric/Behavioral:  Positive for depression. Negative for suicidal ideas. The patient is nervous/anxious and has insomnia.   All other systems reviewed and are negative. Blood pressure 117/66, pulse 93, temperature 98.2 F (36.8 C), temperature source Oral, resp. rate 18, last menstrual period 11/30/2021, SpO2 99 %, not currently breastfeeding. There is no height or weight on file to calculate BMI.  Musculoskeletal: Strength & Muscle Tone: within normal limits Gait & Station: normal Patient leans: N/A   [place] MSE Discharge Disposition for Follow up and Recommendations: Based on my evaluation the patient does not appear to have an emergency medical condition and can be discharged with resources and follow up care in outpatient services for Medication Management and Individual Therapy   Derrill [place], NP 12/20/2021, 12:49 PM

## 2021-12-22 ENCOUNTER — Encounter (HOSPITAL_COMMUNITY): Payer: Self-pay | Admitting: Family

## 2021-12-22 ENCOUNTER — Encounter (HOSPITAL_COMMUNITY): Payer: Self-pay

## 2021-12-22 ENCOUNTER — Inpatient Hospital Stay (HOSPITAL_COMMUNITY)
Admission: AD | Admit: 2021-12-22 | Discharge: 2022-01-01 | DRG: 776 | Disposition: A | Discharge status: Home / Self Care | Payer: BC Managed Care – PPO | Source: Intra-hospital | Attending: Emergency Medicine | Admitting: Emergency Medicine

## 2021-12-22 ENCOUNTER — Other Ambulatory Visit: Payer: Self-pay

## 2021-12-22 ENCOUNTER — Ambulatory Visit (HOSPITAL_COMMUNITY)
Admission: EM | Admit: 2021-12-22 | Discharge: 2021-12-22 | Disposition: A | Discharge status: Other Acute Inpatient Hospital | Payer: BC Managed Care – PPO | Attending: Family | Admitting: Family

## 2021-12-22 DIAGNOSIS — E559 Vitamin D deficiency, unspecified: Secondary | ICD-10-CM | POA: Diagnosis not present

## 2021-12-22 DIAGNOSIS — O99285 Endocrine, nutritional and metabolic diseases complicating the puerperium: Secondary | ICD-10-CM | POA: Diagnosis present

## 2021-12-22 DIAGNOSIS — F4325 Adjustment disorder with mixed disturbance of emotions and conduct: Secondary | ICD-10-CM | POA: Diagnosis not present

## 2021-12-22 DIAGNOSIS — R635 Abnormal weight gain: Secondary | ICD-10-CM | POA: Diagnosis not present

## 2021-12-22 DIAGNOSIS — Z604 Social exclusion and rejection: Secondary | ICD-10-CM | POA: Diagnosis not present

## 2021-12-22 DIAGNOSIS — Z79899 Other long term (current) drug therapy: Secondary | ICD-10-CM | POA: Diagnosis not present

## 2021-12-22 DIAGNOSIS — F323 Major depressive disorder, single episode, severe with psychotic features: Principal | ICD-10-CM | POA: Diagnosis present

## 2021-12-22 DIAGNOSIS — Z20822 Contact with and (suspected) exposure to covid-19: Secondary | ICD-10-CM | POA: Diagnosis present

## 2021-12-22 DIAGNOSIS — D649 Anemia, unspecified: Secondary | ICD-10-CM | POA: Diagnosis not present

## 2021-12-22 DIAGNOSIS — F4323 Adjustment disorder with mixed anxiety and depressed mood: Secondary | ICD-10-CM | POA: Insufficient documentation

## 2021-12-22 DIAGNOSIS — F410 Panic disorder [episodic paroxysmal anxiety] without agoraphobia: Secondary | ICD-10-CM | POA: Diagnosis not present

## 2021-12-22 DIAGNOSIS — O99345 Other mental disorders complicating the puerperium: Principal | ICD-10-CM | POA: Diagnosis present

## 2021-12-22 DIAGNOSIS — E876 Hypokalemia: Secondary | ICD-10-CM | POA: Diagnosis present

## 2021-12-22 DIAGNOSIS — O9081 Anemia of the puerperium: Secondary | ICD-10-CM | POA: Diagnosis present

## 2021-12-22 DIAGNOSIS — F32A Depression, unspecified: Secondary | ICD-10-CM | POA: Diagnosis not present

## 2021-12-22 DIAGNOSIS — F419 Anxiety disorder, unspecified: Secondary | ICD-10-CM | POA: Diagnosis not present

## 2021-12-22 DIAGNOSIS — F53 Postpartum depression: Secondary | ICD-10-CM | POA: Diagnosis not present

## 2021-12-22 LAB — CBC WITH DIFFERENTIAL/PLATELET
Abs Immature Granulocytes: 0.01 10*3/uL (ref 0.00–0.07)
Basophils Absolute: 0 10*3/uL (ref 0.0–0.1)
Basophils Relative: 0 %
Eosinophils Absolute: 0.2 10*3/uL (ref 0.0–0.5)
Eosinophils Relative: 5 %
HCT: 35.7 % — ABNORMAL LOW (ref 36.0–46.0)
Hemoglobin: 11.5 g/dL — ABNORMAL LOW (ref 12.0–15.0)
Immature Granulocytes: 0 %
Lymphocytes Relative: 50 %
Lymphs Abs: 2.4 10*3/uL (ref 0.7–4.0)
MCH: 25.5 pg — ABNORMAL LOW (ref 26.0–34.0)
MCHC: 32.2 g/dL (ref 30.0–36.0)
MCV: 79.2 fL — ABNORMAL LOW (ref 80.0–100.0)
Monocytes Absolute: 0.5 10*3/uL (ref 0.1–1.0)
Monocytes Relative: 9 %
Neutro Abs: 1.8 10*3/uL (ref 1.7–7.7)
Neutrophils Relative %: 36 %
Platelets: 301 10*3/uL (ref 150–400)
RBC: 4.51 MIL/uL (ref 3.87–5.11)
RDW: 14.6 % (ref 11.5–15.5)
WBC: 4.9 10*3/uL (ref 4.0–10.5)
nRBC: 0 % (ref 0.0–0.2)

## 2021-12-22 LAB — POCT URINE DRUG SCREEN - MANUAL ENTRY (I-SCREEN)
POC Amphetamine UR: NOT DETECTED
POC Buprenorphine (BUP): NOT DETECTED
POC Cocaine UR: NOT DETECTED
POC Marijuana UR: NOT DETECTED
POC Methadone UR: NOT DETECTED
POC Methamphetamine UR: NOT DETECTED
POC Morphine: NOT DETECTED
POC Oxazepam (BZO): NOT DETECTED
POC Oxycodone UR: NOT DETECTED
POC Secobarbital (BAR): NOT DETECTED

## 2021-12-22 LAB — COMPREHENSIVE METABOLIC PANEL
ALT: 13 U/L (ref 0–44)
AST: 20 U/L (ref 15–41)
Albumin: 3.6 g/dL (ref 3.5–5.0)
Alkaline Phosphatase: 63 U/L (ref 38–126)
Anion gap: 7 (ref 5–15)
BUN: 9 mg/dL (ref 6–20)
CO2: 27 mmol/L (ref 22–32)
Calcium: 9.2 mg/dL (ref 8.9–10.3)
Chloride: 106 mmol/L (ref 98–111)
Creatinine, Ser: 0.63 mg/dL (ref 0.44–1.00)
GFR, Estimated: >60 mL/min (ref >60)
Glucose, Bld: 87 mg/dL (ref 70–99)
Potassium: 3.9 mmol/L (ref 3.5–5.1)
Sodium: 140 mmol/L (ref 135–145)
Total Bilirubin: 0.3 mg/dL (ref 0.3–1.2)
Total Protein: 6.6 g/dL (ref 6.5–8.1)

## 2021-12-22 LAB — TSH: TSH: 1.753 u[IU]/mL (ref 0.350–4.500)

## 2021-12-22 LAB — PREGNANCY, URINE: Preg Test, Ur: NEGATIVE

## 2021-12-22 LAB — LIPID PANEL
Cholesterol: 177 mg/dL (ref 0–200)
HDL: 58 mg/dL (ref >40)
LDL Cholesterol: 110 mg/dL — ABNORMAL HIGH (ref 0–99)
Total CHOL/HDL Ratio: 3.1 RATIO
Triglycerides: 43 mg/dL (ref <150)
VLDL: 9 mg/dL (ref 0–40)

## 2021-12-22 LAB — POC SARS CORONAVIRUS 2 AG -  ED: SARS Coronavirus 2 Ag: NEGATIVE

## 2021-12-22 LAB — ETHANOL: Alcohol, Ethyl (B): <10 mg/dL (ref <10)

## 2021-12-22 LAB — RESP PANEL BY RT-PCR (FLU A&B, COVID) ARPGX2
Influenza A by PCR: NEGATIVE
Influenza B by PCR: NEGATIVE
SARS Coronavirus 2 by RT PCR: NEGATIVE

## 2021-12-22 LAB — POCT PREGNANCY, URINE: Preg Test, Ur: NEGATIVE

## 2021-12-22 LAB — MAGNESIUM: Magnesium: 1.9 mg/dL (ref 1.7–2.4)

## 2021-12-22 MED ORDER — ACETAMINOPHEN 325 MG PO TABS: 650.0000 mg | ORAL_TABLET | Freq: Four times a day (QID) | ORAL | Status: DC | PRN

## 2021-12-22 MED ORDER — ESCITALOPRAM OXALATE 10 MG PO TABS
10.0000 mg | ORAL_TABLET | Freq: Every day | ORAL | Status: DC
Start: 2021-12-22 — End: 2021-12-22
  Administered 2021-12-22: 10 mg via ORAL
  Filled 2021-12-22: qty 1

## 2021-12-22 MED ORDER — ESCITALOPRAM OXALATE 10 MG PO TABS
10.0000 mg | ORAL_TABLET | Freq: Every day | ORAL | Status: DC
  Administered 2021-12-23 – 2022-01-01 (×10): 10 mg via ORAL
  Filled 2021-12-22 (×12): qty 1

## 2021-12-22 MED ORDER — ALUM & MAG HYDROXIDE-SIMETH 200-200-20 MG/5ML PO SUSP: 30.0000 mL | ORAL | Status: DC | PRN

## 2021-12-22 MED ORDER — MAGNESIUM HYDROXIDE 400 MG/5ML PO SUSP: 30.0000 mL | Freq: Every day | ORAL | Status: DC | PRN

## 2021-12-22 MED ORDER — HYDROXYZINE HCL 25 MG PO TABS
25.0000 mg | ORAL_TABLET | Freq: Three times a day (TID) | ORAL | Status: DC | PRN
Start: 2021-12-22 — End: 2022-01-01

## 2021-12-22 MED ORDER — HYDROXYZINE HCL 25 MG PO TABS
25.0000 mg | ORAL_TABLET | Freq: Three times a day (TID) | ORAL | Status: DC | PRN
Start: 2021-12-22 — End: 2021-12-22

## 2021-12-22 MED ORDER — TRAZODONE HCL 50 MG PO TABS: 50.0000 mg | ORAL_TABLET | Freq: Every evening | ORAL | Status: DC | PRN

## 2021-12-22 MED ORDER — TRAZODONE HCL 50 MG PO TABS
50.0000 mg | ORAL_TABLET | Freq: Every evening | ORAL | Status: DC | PRN
  Filled 2021-12-22: qty 1

## 2021-12-22 NOTE — ED Notes (Signed)
Pt sitting in chair on ops unit.  Spoke minimally with this Clinical research associate.  Denies SI, HI, and AVH at this time.  Pt provides short yes and no answers.  She is sitting with arms crossed and legs crossed looking out window.  Minimal eye contact made with staff during this interaction.  Will continue to monitor for safety.

## 2021-12-22 NOTE — ED Provider Notes (Signed)
Behavioral Health Admission H&P [facility] & OBS)  Date: 12/22/21 Patient Name: Tammy Rosales MRN: [id number] Chief Complaint:  Chief Complaint  Patient presents with   Depression      Diagnoses:  Final diagnoses:  Adjustment disorder with mixed disturbance of emotions and conduct    HPI: Patient presents voluntarily to [facility] behavioral health for walk-in assessment.  Patient is accompanied by her grandmother, Latricia Vallance, who remains present during assessment.  [name] states "my mom says I need help because I am closed off and do not talk."  Patient presents with guarded and labile affect.  She answers assessment questions only with "yes" or "no" answers.  She presents with little to no eye contact. Patient has been assessed at [facility] behavioral health previously 2 times during the past 2 months.  She was seen by providers at [facility] health on 10/31/2021 and 12/20/2021 as well as today.  Patient endorses symptoms of depression including social isolation, sleeping more than typical, and weight gain. [name] believes she may have gained approximately 10 pounds during the last 2 months.  Per medical record patient has been diagnosed with anxiety and depression.  She is not currently linked with outpatient psychiatry, no outpatient counseling.  She denies current medications.  No family mental health history reported.  Patient is reluctant to participate in assessment.  She does share that recent stressors include that she broke her phone 1 week ago because "I did not want to see anything on my phone so I broke it."  Patient is minimally involved in self-care and activities of daily living.  Patient reports that she has been bathing very rarely recently.  Shares that she pays only once during the last week while her mother was watching her 20-month-old child.  Patient is assessed, face-to-face, by nurse practitioner.  She is seated in assessment area, no  acute distress.  She is alert and oriented, minimally cooperative during assessment.  She presents with depressed mood. She denies suicidal and homicidal ideations.  She denies history of suicide attempts, denies history of nonsuicidal self-harm behavior.  She denies auditory and visual hallucinations.  Patient is able to converse coherently with goal-directed thoughts and no distractibility or preoccupation.    Objectively there is no evidence of psychosis/mania or delusional thinking.  Patient resides in [place] with her mother, her 3 siblings and her 38-month-old daughter.  She denies access to weapons.  She is not currently employed.  She denies alcohol and substance use.  Discussed plan to initiate escitalopram 10 mg daily to address depressed mood, discussed side effects and allow patient to ask questions.  Patient agrees with plan to initiate escitalopram at this time.  Patient offered support and encouragement.  Patient gives verbal consent to speak with her grandmother, Charlette Warmuth.  Patient's grandmother verbalizes concern that patient has experienced a change in mood prior to the birth of her daughter 8 months ago.  She reports increasing concern as behaviors exhibited by patient have become more frequent and bizarre. Patient has walked away from her mother's home with her daughter 3 times during the past 2 months.  On last Tuesday, patient left the home of her mother around midnight on foot, with her 39-month-old daughter, without alerting anyone in the home.  Police were called and ultimately canine units were used to search for patient who was found on her mother's back porch reportedly with a "blank stare." 1 week ago, after a primary care provider appointment, patient's mother was  delayed at discharge desk and patient attempted to walk down the side of the road toward home.  Police were called to report patient is walking along the side of a road while holding 16-month-old  daughter. Additionally patient's grandmother verbalizes concern that patient is "constantly packing and throwing stuff away."  Patient's grandmother speculates this could potentially be because patient wants to "disappear."    PHQ 2-9:  [place] Office Visit from 12/07/2021 in [place] Routine Prenatal from 04/21/2021 in [place] for [employer] at [facility] Initial Prenatal from 04/12/2021 in [place] for [employer] at [facility]  Thoughts that you would be better off dead, or of hurting yourself in some way Not at all Not at all Not at all  PHQ-9 Total Score 16 5 15        Flowsheet Row Admission (Discharged) from 04/28/2021 in [place] Assessment Unit  C-SSRS RISK CATEGORY No Risk        Total Time spent with patient: 30 minutes  Musculoskeletal  Strength & Muscle Tone: within normal limits Gait & Station: normal Patient leans: N/A  Psychiatric Specialty Exam  Presentation General Appearance: Appropriate for Environment; Casual  Eye Contact:Minimal  Speech:Clear and Coherent; Normal Rate  Speech Volume:Decreased  Handedness:Right   Mood and Affect  Mood:Depressed; Labile  Affect:Depressed; Labile   Thought Process  Thought Processes:Coherent; Linear  Descriptions of Associations:Intact  Orientation:Full (Time, Place and Person)  Thought Content:Logical    Hallucinations:Hallucinations: None  Ideas of Reference:None  Suicidal Thoughts:Suicidal Thoughts: No  Homicidal Thoughts:Homicidal Thoughts: No   Sensorium  Memory:Immediate Good; Recent Good  Judgment:Intact  Insight:Shallow   Executive Functions  Concentration:Good  Attention Span:Good  [place] of Knowledge:Good  Language:Good   Psychomotor Activity  Psychomotor Activity:Psychomotor Activity: Normal   Assets  Assets:Communication Skills; Financial Resources/Insurance; Housing;  Intimacy; Leisure Time; Physical Health; Resilience; Social Support   Sleep  Sleep:Sleep: Fair   Nutritional Assessment (For OBS and FBC admissions only) Has the patient had a weight loss or gain of 10 pounds or more in the last 3 months?: Yes Has the patient had a decrease in food intake/or appetite?: No Does the patient have dental problems?: No Does the patient have eating habits or behaviors that may be indicators of an eating disorder including binging or inducing vomiting?: No Has the patient recently lost weight without trying?: 0 Has the patient been eating poorly because of a decreased appetite?: 0 Malnutrition Screening Tool Score: 0    Physical Exam Vitals and nursing note reviewed.  Constitutional:      Appearance: Normal appearance. She is well-developed.  HENT:     Head: Normocephalic and atraumatic.     Nose: Nose normal.  Cardiovascular:     Rate and Rhythm: Normal rate.  Pulmonary:     Effort: Pulmonary effort is normal.  Musculoskeletal:        General: Normal range of motion.     Cervical back: Normal range of motion.  Skin:    General: Skin is warm and dry.  Neurological:     Mental Status: She is alert and oriented to person, place, and time.  Psychiatric:        Attention and Perception: Attention and perception normal.        Mood and Affect: Mood is depressed. Affect is flat.        Speech: Speech normal.        Behavior: Behavior is  withdrawn. Behavior is cooperative.        Thought Content: Thought content normal.        Cognition and Memory: Cognition normal.   Review of Systems  Constitutional: Negative.   HENT: Negative.    Eyes: Negative.   Respiratory: Negative.    Cardiovascular: Negative.   Gastrointestinal: Negative.   Genitourinary: Negative.   Musculoskeletal: Negative.   Skin: Negative.   Neurological: Negative.   Endo/Heme/Allergies: Negative.   Psychiatric/Behavioral:  Positive for depression.    Blood pressure 102/63,  pulse (!) 56, temperature 98.6 F (37 C), temperature source Oral, resp. rate 18, last menstrual period 11/30/2021, SpO2 100 %, not currently breastfeeding. There is no height or weight on file to calculate BMI.  Past Psychiatric History: Per medical record-anxiety and depression  Is the patient at risk to self? Yes  Has the patient been a risk to self in the past 6 months? No .    Has the patient been a risk to self within the distant past? No   Is the patient a risk to others? No   Has the patient been a risk to others in the past 6 months? No   Has the patient been a risk to others within the distant past? No   Past Medical History:  Past Medical History:  Diagnosis Date   History of preterm labor 06/06/2021   SVD 03/2021 at 73 weeks   Medical history non-contributory     Past Surgical History:  Procedure Laterality Date   DILATION AND CURETTAGE OF UTERUS     NO PAST SURGERIES      Family History:  Family History  Problem Relation Age of Onset   Hypertension Maternal Aunt     Social History:  Social History   Socioeconomic History   Marital status: Single    Spouse name: Not on file   Number of children: Not on file   Years of education: Not on file   Highest education level: Not on file  Occupational History   Not on file  Tobacco Use   Smoking status: Never   Smokeless tobacco: Never  Vaping Use   Vaping Use: Never used  Substance and Sexual Activity   Alcohol use: Not Currently   Drug use: Not Currently   Sexual activity: Not Currently  Other Topics Concern   Not on file  Social History Narrative   Not on file   Social Determinants of Health   Financial Resource Strain: Not on file  Food Insecurity: Food Insecurity Present   Worried About [place] in the Last Year: Sometimes true   Ran Out of Food in the Last Year: Sometimes true  Transportation Needs: Unmet Transportation Needs   Lack of Transportation (Medical): Yes   Lack of  Transportation (Non-Medical): Yes  Physical Activity: Not on file  Stress: Not on file  Social Connections: Not on file  Intimate Partner Violence: Not on file    SDOH:  SDOH Screenings   Alcohol Screen: Not on file  Depression (PHQ2-9): Medium Risk   PHQ-2 Score: 16  Financial Resource Strain: Not on file  Food Insecurity: Food Insecurity Present   Worried About Charity fundraiser in the Last Year: Sometimes true   Arboriculturist in the Last Year: Sometimes true  Housing: Not on file  Physical Activity: Not on file  Social Connections: Not on file  Stress: Not on file  Tobacco Use: Low Risk  Smoking Tobacco Use: Never   Smokeless Tobacco Use: Never   Passive Exposure: Not on file  Transportation Needs: Unmet Transportation Needs   Lack of Transportation (Medical): Yes   Lack of Transportation (Non-Medical): Yes    Last Labs:  Lab on 12/14/2021  Component Date Value Ref Range Status   Path Review 12/14/2021    Final   Comment: Myeloid population consists predominantly of mature segmented neutrophils. Rare atypical lymphs. RBCs appear to be microcytic and hypochromic on smear  review. Suggest evaluation for iron deficiency, if clinically indicated. Review of the peripheral smear reveals adequate numbers of platelets. Reviewed by Francis Gaines Rockne Coons, MD  (Electronic Signature on [id number].    WBC 12/14/2021 5.7  4.5 - 10.5 K/uL Final   RBC 12/14/2021 4.72  3.87 - 5.11 Mil/uL Final   Hemoglobin 12/14/2021 11.8 (L)  12.0 - 15.0 g/dL Final   HCT 12/14/2021 37.2  36.0 - 46.0 % Final   MCV 12/14/2021 78.9  78.0 - 100.0 fl Final   MCHC 12/14/2021 31.7  30.0 - 36.0 g/dL Final   RDW 12/14/2021 15.0 (H)  11.5 - 14.6 % Final   Platelets 12/14/2021 245.0  150.0 - 400.0 K/uL Final   Neutrophils Relative % 12/14/2021 32.0 (L)  43.0 - 77.0 % Final   Lymphocytes Relative 12/14/2021 54.8 (H)  12.0 - 46.0 % Final   Monocytes Relative 12/14/2021 9.8  3.0 - 12.0 %  Final   Eosinophils Relative 12/14/2021 2.7  0.0 - 5.0 % Final   Basophils Relative 12/14/2021 0.7  0.0 - 3.0 % Final   Neutro Abs 12/14/2021 1.8  1.4 - 7.7 K/uL Final   Lymphs Abs 12/14/2021 3.1  0.7 - 4.0 K/uL Final   Monocytes Absolute 12/14/2021 0.6  0.1 - 1.0 K/uL Final   Eosinophils Absolute 12/14/2021 0.2  0.0 - 0.7 K/uL Final   Basophils Absolute 12/14/2021 0.0  0.0 - 0.1 K/uL Final  Office Visit on 12/07/2021  Component Date Value Ref Range Status   WBC 12/07/2021 4.0 (L)  4.5 - 10.5 K/uL Final   RBC 12/07/2021 4.66  3.87 - 5.11 Mil/uL Final   Hemoglobin 12/07/2021 11.8 (L)  12.0 - 15.0 g/dL Final   HCT 12/07/2021 36.8  36.0 - 46.0 % Final   MCV 12/07/2021 78.9  78.0 - 100.0 fl Final   MCHC 12/07/2021 32.2  30.0 - 36.0 g/dL Final   RDW 12/07/2021 15.5 (H)  11.5 - 14.6 % Final   Platelets 12/07/2021 226.0  150.0 - 400.0 K/uL Final   Neutrophils Relative % 12/07/2021 26.5 (L)  43.0 - 77.0 % Final   Lymphocytes Relative 12/07/2021 62.7 Repeated and verified X2. (H)  12.0 - 46.0 % Final   Monocytes Relative 12/07/2021 7.3  3.0 - 12.0 % Final   Eosinophils Relative 12/07/2021 2.7  0.0 - 5.0 % Final   Basophils Relative 12/07/2021 0.8  0.0 - 3.0 % Final   Neutro Abs 12/07/2021 1.1 (L)  1.4 - 7.7 K/uL Final   Lymphs Abs 12/07/2021 2.5  0.7 - 4.0 K/uL Final   Monocytes Absolute 12/07/2021 0.3  0.1 - 1.0 K/uL Final   Eosinophils Absolute 12/07/2021 0.1  0.0 - 0.7 K/uL Final   Basophils Absolute 12/07/2021 0.0  0.0 - 0.1 K/uL Final   Sodium 12/07/2021 141  135 - 145 mEq/L Final   Potassium 12/07/2021 3.6  3.5 - 5.1 mEq/L Final   Chloride 12/07/2021 106  96 - 112 mEq/L  Final   CO2 12/07/2021 30  19 - 32 mEq/L Final   Glucose, Bld 12/07/2021 106 (H)  70 - 99 mg/dL Final   BUN 12/07/2021 11  6 - 23 mg/dL Final   Creatinine, Ser 12/07/2021 0.66  0.40 - 1.20 mg/dL Final   Total Bilirubin 12/07/2021 0.2  0.2 - 1.2 mg/dL Final   Alkaline Phosphatase 12/07/2021 80  39 - 117 U/L Final    AST 12/07/2021 15  0 - 37 U/L Final   ALT 12/07/2021 10  0 - 35 U/L Final   Total Protein 12/07/2021 7.1  6.0 - 8.3 g/dL Final   Albumin 12/07/2021 3.9  3.5 - 5.2 g/dL Final   GFR 12/07/2021 126.18  >60.00 mL/min Final   Calculated using the CKD-EPI Creatinine Equation (2021)   Calcium 12/07/2021 9.2  8.4 - 10.5 mg/dL Final    Allergies: Patient has no known allergies.  PTA Medications: (Not in a hospital admission)   Medical Decision Making  Patient reviewed with Dr. Hampton Abbot.  She will be placed in observation area at [facility] behavioral health while awaiting inpatient psychiatric hospitalization.  She remains voluntary at this time.  Laboratory studies ordered including CBC, CMP, ethanol, A1c, hepatic function, lipid panel, magnesium and TSH.  Urine pregnancy and urine drug screen ordered.  EKG order initiated.  Current medications: -Acetaminophen 650 mg every 6 as needed/mild pain -Maalox 30 mL oral every 4 as needed/digestion -Hydroxyzine 25 mg 3 times daily as needed/anxiety -Magnesium hydroxide 30 mL daily as needed/mild constipation -Trazodone 50 mg nightly as needed/sleep  -Escitalopram 10 mg daily/mood coming in  Recommendations  Based on my evaluation the patient does not appear to have an emergency medical condition.  Lucky Rathke, FNP 12/22/21  10:59 AM

## 2021-12-22 NOTE — Progress Notes (Signed)
BHH/BMU LCSW Progress Note   12/22/2021    2:28 PM  Tammy Rosales   [id number]   Type of Contact and Topic:  Psychiatric Bed Placement   Pt accepted to [facility] 407-1     Patient meets inpatient criteria per Doran Heater, NP   The attending provider will be Haze Rushing, MD   Call report to [phone]    Isaiah Serge, LPN @ [facility] notified.     Pt scheduled  to arrive at [facility] TODAY. Patient's bed is currently ready.   Damita Dunnings, MSW, LCSW-A  2:30 PM 12/22/2021

## 2021-12-22 NOTE — Tx Team (Signed)
Initial Treatment Plan 12/22/2021 7:10 PM [name] POE:[id number]    PATIENT STRESSORS: Financial difficulties   Other: Transportation Needs     PATIENT STRENGTHS: Capable of independent living  Communication skills  Physical Health  Supportive family/friends    PATIENT IDENTIFIED PROBLEMS: Alterations in mood (Depressed) "I have noticed that I have been isolating, closed off & excluded from everybody".    Ineffective coping skills (erractic / risky behaviors-walking on h[address] with her 31 month old baby).    Hypersomnia "I've been sleeping a lot. I sleep when my baby sleep".             DISCHARGE CRITERIA:  Ability to meet basic life and health needs Improved stabilization in mood, thinking, and/or behavior Verbal commitment to aftercare and medication compliance  PRELIMINARY DISCHARGE PLAN: Attend aftercare/continuing care group Outpatient therapy Return to previous living arrangement  PATIENT/FAMILY INVOLVEMENT: This treatment plan has been presented to and reviewed with the patient, Tammy Rosales, and/or family member.  The patient and family have been given the opportunity to ask questions and make suggestions.  Sherryl Manges, RN 12/22/2021, 7:10 PM

## 2021-12-22 NOTE — ED Notes (Signed)
Pt discharged with belongings.  Pt aware she will be going inpatient at [facility].  Pt alert, oriented, and ambulatory.  Safety maintained.

## 2021-12-22 NOTE — Progress Notes (Signed)
Pt is a 21 y/o [place] American female and is 8 month post partum. She's admitted to [facility] from [facility] where she presented voluntarily with her grandmother for depression and anxiety since giving birth. Per chart review pt has been engaging in risky behaviors lately such as leaving home late at night, walking on the highway with her 42 month old child. Police had to be called to bring pt home. She presents guarded with blunted affect, avertive eye contact, irritable mood, inappropriate laughter at intervals during assessment. Pt's speech is logical with appropriate but very minimal responses to assessment question. Denies SI, HI, AVH and pain at this time. Per pt "I'm here because my mother and grandmother wants me to get help. I have noticed that I've been distant, closed off and excluded from everybody. Just thinking about my baby Genesis keeps me awake" when asked about events leading to admission. Pt denies substance abuse and UDS is negative. Pt denies history of sexual, physical and verbal abuse as well. Reports she currently lives with her mother. Does acknowledges that caring for her child has made her more isolative and that she's in need of help. Reports she sleeps well with fair appetite. Pt cooperative with admission process. Skin assessment done, tattoos noted on bilateral forearms. Pair of sweat suits with laces secured in assigned locker. Ambulatory to unit with a steady gait. Unit orientation done, routines discussed, care plan reviewed with pt and admission documents signed. Emotional support and reassurance offered to pt. Writer encouraged pt to voice concerns. Q 15 minutes safety checks initiated without self harm gestures or outburst. Pt declined fluids and snacks when offered. Calm, awake in room. Denies concerns at this time.

## 2021-12-22 NOTE — ED Triage Notes (Signed)
Pt presents to [facility] accompanied by her mother and her grandparents. Pts mother states that the pt has worsening depression symptoms. Pt is quiet during triage process only answering questions with yes and no. Pt appears to be staring out into space during triage process. Pts mother states that pts behavior has changed over the course of the past few months when she returned from school in November 2022. Pt had a child 8 months ago and has been unorganized, impulsive, anger outbursts, crying spells, sitting in dark room, increased sleep and giving the silent treatment. Pts mother states that the pt has "episodes" where she will take her baby and leave the house without notifiying anyone and goes missing for an extended period of time. The last "episode" the pts mother had to call the police to locate the pt and her baby. Pts mother states that the pt would benefit from being set up with a therapist. Pts mother states that she is concerned for the pts mental health and her baby. Pt denies SI/HI and AVH at this time. Pt is urgent.

## 2021-12-22 NOTE — ED Notes (Signed)
Patient arrived on unit. Patient calm and cooperative. Patient has poor affect aeb reserved behavior and holding head down. Patient safe on unit with continued monitoring.

## 2021-12-22 NOTE — ED Notes (Signed)
STAT lab courier called to transport labs to [facility] lab 

## 2021-12-22 NOTE — BH Assessment (Signed)
Comprehensive Clinical Assessment (CCA) Note  12/22/2021 Tammy Rosales [id number]  Chief Complaint:  Chief Complaint  Patient presents with   Depression   Visit Diagnosis:  F32.2 Major depressive disorder, Single episode, Severe F41.1 Generalized anxiety disorder   Flowsheet Row ED from 12/22/2021 in [facility] Admission (Discharged) from 04/28/2021 in [facility] M[facility] Assessment Unit  C-SSRS RISK CATEGORY Low Risk No Risk      The patient demonstrates the following risk factors for suicide: Chronic risk factors for suicide include: psychiatric disorder of anxiety and depression disorder . Acute risk factors for suicide include: social withdrawal/isolation. Protective factors for this patient include: positive social support, positive therapeutic relationship, responsibility to others (children, family), and coping skills. Considering these factors, the overall suicide risk at this point appears to be low. Patient is not appropriate for outpatient follow up.  Disposition: Beatriz Stallion NP, patient meets inpatient criteria.  [facility] AC contacted and bed availability under review. Disposition discussed with Hydrologist at [facility].  Tammy Rosales is a 21 year old single patient who presents voluntarily to [facility] and accompanied by her grandmother, Tammy Rosales, [phone]. Pt gave TTS permission to contact her mother, Tammy Rosales, unable to contact or leave a message.  Pt's grandmother reports she have depression and anxiety. Pt denies SI, HI, AVH.  Pt grandmother reports that she left home late, during the night with her 13 old baby walking the highway; also the police was called and brought her back to the house.  Writer asked ' where did you walk to?".  Pt  stated "somewhere, [employer], I needed to get something I broke".  Pt grandmother acknowledged the following symptoms: withdrawn, irritable, fatigue, worthless, difficulty concentrating, restlessness,  sadness, and hopelessness. Pt's grandmother reports that she is sleeping excessively, more than eight hours during the night.  Pt reports that she eats daily. Pt denies drinking alcohol or using any other substance.  Pt unable to identifies her primary stressor.  Pt reports that both she and her baby live with her mother.  Pt reports that she is currently unemployed. Pt denies family mental illness.  Pt also, denies family substance used.  Pt denies any history of abuse or trauma.  Pt denies any current legal problems.  Pt denies any guns in the home.   Pt says she is not currently receiving weekly outpatient therapy; also is not receiving outpatient medication management.  Pt reports no previous inpatient psychiatric hospitalization.  Pt is dressed casual, alert, oriented x 3 with slow speech and calm behavior.  Eye contact is staring.  Pt mood is depressed and affect is hopeless.  Though process relevant.  Pt's insight is lacking and judgment is poor.  There is no indication Pt is currently responding to internal stimuli or experiencing delusional thought content.  Pt was passive and guarded throughout assessment.    CCA Screening, Triage and Referral (STR)  Patient Reported Information How did you hear about [place]? Family/Friend  What Is the Reason for Your Visit/Call Today? Pt presents to [facility] accompanied by her mother and her grandparents. Pts mother states that the pt has worsening depression symptoms. Pt is quiet during triage process only answering questions with yes and no. Pt appears to be staring out into space during triage process. Pts mother states that pts behavior has changed over the course of the past few months when she returned from school in November 2022. Pt had a child 8 months ago and has been unorganized, impulsive,  anger outbursts, crying spells, sitting in dark room, increased sleep and giving the silent treatment. Pts mother states that the pt has "episodes" where she will  take her baby and leave the house without notifiying anyone and goes missing for an extended period of time. The last "episode" the pts mother had to call the police to locate the pt and her baby. Pts mother states that the pt would benefit from being set up with a therapist. Pts mother states that she is concerned for the pts mental health and her baby. Pt denies SI/HI and AVH at this time. Pt is urgent.  How Long Has This Been Causing You Problems? 1-6 months  What Do You Feel Would Help You the Most Today? Treatment for Depression or other mood problem   Have You Recently Had Any Thoughts About Hurting Yourself? No  Are You Planning to Commit Suicide/Harm Yourself At This time? No   Have you Recently Had Thoughts About [place]? No  Are You Planning to Harm Someone at This Time? No  Explanation: No data recorded  Have You Used Any Alcohol or Drugs in the Past 24 Hours? No  How Long Ago Did You Use Drugs or Alcohol? No data recorded What Did You Use and How Much? No data recorded  Do You Currently Have a Therapist/Psychiatrist? No data recorded Name of Therapist/Psychiatrist: No data recorded  Have You Been Recently Discharged From Any Office Practice or Programs? No data recorded Explanation of Discharge From Practice/Program: No data recorded    CCA Screening Triage Referral Assessment Type of Contact: No data recorded Telemedicine Service Delivery:   Is this Initial or Reassessment? No data recorded Date Telepsych consult ordered in [facility]:  No data recorded Time Telepsych consult ordered in [facility]:  No data recorded Location of Assessment: No data recorded Provider Location: No data recorded  Collateral Involvement: No data recorded  Does Patient Have a [place]? No data recorded Name and Contact of Legal Guardian: No data recorded If Minor and Not Living with Parent(s), Who has Custody? No data recorded Is CPS involved or ever been  involved? No data recorded Is APS involved or ever been involved? No data recorded  Patient Determined To Be At Risk for Harm To Self or Others Based on Review of Patient Reported Information or Presenting Complaint? No data recorded Method: No data recorded Availability of Means: No data recorded Intent: No data recorded Notification Required: No data recorded Additional Information for Danger to Others Potential: No data recorded Additional Comments for Danger to Others Potential: No data recorded Are There Guns or Other Weapons in Your Home? No data recorded Types of Guns/Weapons: No data recorded Are These Weapons Safely Secured?                            No data recorded Who Could Verify You Are Able To Have These Secured: No data recorded Do You Have any Outstanding Charges, Pending Court Dates, Parole/Probation? No data recorded Contacted To Inform of Risk of Harm To Self or Others: No data recorded   Does Patient Present under Involuntary Commitment? No data recorded IVC Papers Initial File Date: No data recorded  [place] of Residence: No data recorded  Patient Currently Receiving the Following Services: No data recorded  Determination of Need: Urgent (48 hours)   Options For Referral: Outpatient Therapy; Medication Management     CCA Biopsychosocial Patient Reported Schizophrenia/Schizoaffective  Diagnosis in Past: No   Strengths: Listening   Mental Health Symptoms Depression:   Change in energy/activity; Difficulty Concentrating; Fatigue; Hopelessness; Irritability; Worthlessness   Duration of Depressive symptoms:  Duration of Depressive Symptoms: Greater than two weeks   Mania:   None   Anxiety:    Fatigue; Irritability; Restlessness; Tension; Worrying; Difficulty concentrating   Psychosis:   None   Duration of Psychotic symptoms:    Trauma:   None   Obsessions:   Disrupts routine/functioning   Compulsions:   None   Inattention:   None    Hyperactivity/Impulsivity:   None   Oppositional/Defiant Behaviors:   None   Emotional Irregularity:   Chronic feelings of emptiness; Potentially harmful impulsivity   Other Mood/Personality Symptoms:   Depressed/Irritable    Mental Status Exam Appearance and self-care  Stature:   Small   Weight:   Thin   Clothing:   Casual   Grooming:   Normal   Cosmetic use:   Age appropriate   Posture/gait:   Normal   Motor activity:   Agitated; Slowed   Sensorium  Attention:   Inattentive   Concentration:   Variable   Orientation:   Object; Person; Place   Recall/memory:   Normal   Affect and Mood  Affect:   Depressed   Mood:   Irritable; Negative; Depressed; Hopeless   Relating  Eye contact:   Staring   Facial expression:   Sad; Depressed   Attitude toward examiner:   Resistant; Passive; Guarded   Thought and Language  Speech flow:  Slow   Thought content:   Suspicious   Preoccupation:   None   Hallucinations:   None   Organization:  No data recorded  [employer] of Knowledge:   Fair   Intelligence:   Average   Abstraction:   Normal   Judgement:   Poor   Reality Testing:   Variable   Insight:   Lacking   Decision Making:   Impulsive   Social Functioning  Social Maturity:   Isolates   Social Judgement:   Impropriety   Stress  Stressors:   Family conflict   Coping Ability:   Programme researcher, broadcasting/film/video Deficits:   Environmental health practitioner; Self-care; Self-control   Supports:   Family     Religion: Religion/Spirituality How Might This Affect Treatment?: UTA  Leisure/Recreation: Leisure / Recreation Do You Have Hobbies?: Yes Leisure and Hobbies: Pt's grandmother reports she use to run  Exercise/Diet: Exercise/Diet Do You Exercise?: No Have You Gained or Lost A Significant Amount of Weight in the Past Six Months?: No Do You Follow a Special Diet?: No Do You Have Any Trouble Sleeping?: No (Pt's  grandmother reports she sleep excesively, more than eight hours during the night.)   CCA Employment/Education Employment/Work Situation: Employment / Work Situation Employment Situation: Unemployed Patient's Job has Been Impacted by Current Illness: No Has Patient ever Been in Passenger transport manager?: No  Education: Education Is Patient Currently Attending School?: Yes School Currently Attending: [employer] Last Grade Completed: 12 Did You Nutritional therapist?:  (UTA) Did You Have An Individualized Education Program (IIEP):  (UTA) Did You Have Any Difficulty At School?:  (UTA) Patient's Education Has Been Impacted by Current Illness: No (UTA)   CCA Family/Childhood History Family and Relationship History: Family history Marital status: Single Does patient have children?: Yes How many children?: 1 How is patient's relationship with their children?: close  Childhood History:  Childhood History By whom was/is the  patient raised?: Mother Did patient suffer any verbal/emotional/physical/sexual abuse as a child?: No Did patient suffer from severe childhood neglect?: No Has patient ever been sexually abused/assaulted/raped as an adolescent or adult?: No Was the patient ever a victim of a crime or a disaster?: No Witnessed domestic violence?: No Has patient been affected by domestic violence as an adult?: No  Child/Adolescent Assessment:     CCA Substance Use Alcohol/Drug Use: Alcohol / Drug Use Pain Medications: See MRA Prescriptions: See MRA Over the Counter: See Mra History of alcohol / drug use?: No history of alcohol / drug abuse                         ASAM's:  Six Dimensions of Multidimensional Assessment  Dimension 1:  Acute Intoxication and/or Withdrawal Potential:      Dimension 2:  Biomedical Conditions and Complications:      Dimension 3:  Emotional, Behavioral, or Cognitive Conditions and Complications:     Dimension 4:  Readiness to Change:      Dimension 5:  Relapse, Continued use, or Continued Problem Potential:     Dimension 6:  Recovery/Living Environment:     ASAM Severity Score:    ASAM Recommended Level of Treatment:     Substance use Disorder (SUD)    Recommendations for Services/Supports/Treatments: Recommendations for Services/Supports/Treatments Recommendations For Services/Supports/Treatments: Inpatient Hospitalization  Discharge Disposition:    DSM5 Diagnoses: Patient Active Problem List   Diagnosis Date Noted   Anxiety and depression 12/07/2021   Birth control counseling 12/07/2021     Referrals to Alternative Service(s): Referred to Alternative Service(s):   Place:   Date:   Time:    Referred to Alternative Service(s):   Place:   Date:   Time:    Referred to Alternative Service(s):   Place:   Date:   Time:    Referred to Alternative Service(s):   Place:   Date:   Time:     Leonides Schanz, Counselor

## 2021-12-22 NOTE — Progress Notes (Signed)
D:  Tammy Rosales was isolative to her room this evening.  She was found laying on the bench in her room.  Minimal interaction with staff.  She would only answer with nodding or shaking of her head.  No eye contact.  Guarded and fearful.  She denied SI/HI or AVH.  She declined medication to help her sleep.  She denied any pain or discomfort and appeared to be in no physical distress.   A:  1:1 with RN for support and encouragement.  Offered PRN medications.  Q 15 minute checks maintained for safety.  Encouraged participation in group and unit activities.   R:  She is currently resting with her eyes closed and appears to be asleep.  She remains safe on the unit.

## 2021-12-22 NOTE — Group Note (Signed)
Date:  12/22/2021 °Time:  4:28 PM ° °Group Topic/Focus:  °Wellness Toolbox:   The focus of this group is to discuss various aspects of wellness, balancing those aspects and exploring ways to increase the ability to experience wellness.  Patients will create a wellness toolbox for use upon discharge. ° ° ° °Participation Level:  Did Not Attend ° °Participation Quality:   ° °Affect:   °Cognitive:   ° °Insight:  ° °Engagement in Group:   ° °Modes of Intervention:   ° °Additional Comments:   ° °Alainna Stawicki Lashawn Savas °12/22/2021, 4:28 PM ° °

## 2021-12-23 ENCOUNTER — Encounter (HOSPITAL_COMMUNITY): Payer: Self-pay

## 2021-12-23 DIAGNOSIS — F53 Postpartum depression: Secondary | ICD-10-CM | POA: Diagnosis present

## 2021-12-23 LAB — HEMOGLOBIN A1C
Hgb A1c MFr Bld: 5.7 % — ABNORMAL HIGH (ref 4.8–5.6)
Mean Plasma Glucose: 117 mg/dL

## 2021-12-23 LAB — PROLACTIN: Prolactin: 15.7 ng/mL (ref 4.8–23.3)

## 2021-12-23 MED ORDER — ZIPRASIDONE MESYLATE 20 MG IM SOLR: 20.0000 mg | INTRAMUSCULAR | Status: DC | PRN

## 2021-12-23 MED ORDER — OLANZAPINE 5 MG PO TBDP
5.0000 mg | ORAL_TABLET | Freq: Every day | ORAL | Status: DC
  Administered 2021-12-23: 5 mg via ORAL
  Filled 2021-12-23 (×4): qty 1

## 2021-12-23 MED ORDER — LORAZEPAM 1 MG PO TABS: 1.0000 mg | ORAL_TABLET | ORAL | Status: DC | PRN

## 2021-12-23 MED ORDER — OLANZAPINE 5 MG PO TBDP
5.0000 mg | ORAL_TABLET | Freq: Three times a day (TID) | ORAL | Status: DC | PRN
Start: 2021-12-23 — End: 2022-01-01

## 2021-12-23 NOTE — Progress Notes (Signed)
°   12/23/21 0830  Psych Admission Type (Psych Patients Only)  Admission Status Voluntary  Psychosocial Assessment  Patient Complaints Depression;Irritability  Eye Contact Avoids  Facial Expression Anxious  Affect Irritable  Speech Logical/coherent  Interaction Cautious  Motor Activity Slow  Appearance/Hygiene Unremarkable  Behavior Characteristics Guarded  Mood Depressed  Thought Process  Coherency Blocking  Content Blaming others;Blaming self  Delusions None reported or observed  Perception WDL  Hallucination None reported or observed  Judgment WDL  Confusion None  Danger to Self  Current suicidal ideation? Denies       COVID-19 Daily Checkoff  Have you had a fever (temp > 37.80C/100F)  in the past 24 hours?  No  If you have had runny nose, nasal congestion, sneezing in the past 24 hours, has it worsened? No  COVID-19 EXPOSURE  Have you traveled outside the state in the past 14 days? No  Have you been in contact with someone with a confirmed diagnosis of COVID-19 or PUI in the past 14 days without wearing appropriate PPE? No  Have you been living in the same home as a person with confirmed diagnosis of COVID-19 or a PUI (household contact)? No  Have you been diagnosed with COVID-19? No

## 2021-12-23 NOTE — BHH Group Notes (Signed)
Patient did not attend the relaxation group. 

## 2021-12-23 NOTE — BHH Counselor (Signed)
Adult Comprehensive Assessment  Patient ID: Tammy Rosales, female   DOB: 2001/09/10, 21 y.o.   MRN: [id number]  Information Source: Information source: Patient  Current Stressors:  Patient states their primary concerns and needs for treatment are:: "Anxiety for several months" Patient states their goals for this hospitilization and ongoing recovery are:: "To get some help" Educational / Learning stressors: Pt reports a 12th grade education and some college Employment / Job issues: Pt reports being unemployed Family Relationships: Pt reports having no relationship with her father Museum/gallery curator / Lack of resources (include bankruptcy): Pt reports her mother help financially Housing / Lack of housing: Pt reports living with her mother and her 2 month old daughter Physical health (include injuries & life threatening diseases): Pt reports no stressors Social relationships: Pt reports few social relationships Substance abuse: Pt denies all substance use Bereavement / Loss: Pt reports no stressors  Living/Environment/Situation:  Living Arrangements: Parent, Children Living conditions (as described by patient or guardian): Apartment/Family Who else lives in the home?: Mother and child How long has patient lived in current situation?: 4 years What is atmosphere in current home: Comfortable, Supportive  Family History:  Marital status: Single Are you sexually active?: Yes What is your sexual orientation?: Heterosexual Has your sexual activity been affected by drugs, alcohol, medication, or emotional stress?: No Does patient have children?: Yes How many children?: 1 How is patient's relationship with their children?: "It's fine but it is difficult"  Childhood History:  By whom was/is the patient raised?: Mother, Grandparents Engineer, petroleum) Additional childhood history information: Pt reports having no relationship with her father Description of patient's relationship with caregiver when they were a  child: "It was good" Patient's description of current relationship with people who raised him/her: "It is fine" How were you disciplined when you got in trouble as a child/adolescent?: Spankings Does patient have siblings?: Yes Number of Siblings: 3 Description of patient's current relationship with siblings: "We get along fine" Did patient suffer any verbal/emotional/physical/sexual abuse as a child?: No Did patient suffer from severe childhood neglect?: No Has patient ever been sexually abused/assaulted/raped as an adolescent or adult?: No Was the patient ever a victim of a crime or a disaster?: No Witnessed domestic violence?: No Has patient been affected by domestic violence as an adult?: No  Education:  Highest grade of school patient has completed: 12th grade, Some college Currently a student?: No Learning disability?: No  Employment/Work Situation:   Employment Situation: Unemployed Patient's Job has Been Impacted by Current Illness: No What is the Longest Time Patient has Held a Job?: 1 year Where was the Patient Employed at that Time?: [place] Has Patient ever Been in the [employer]?: No  Financial Resources:   Museum/gallery curator resources: Support from parents / caregiver, Multimedia programmer Does patient have a Programmer, applications or guardian?: No  Alcohol/Substance Abuse:   What has been your use of drugs/alcohol within the last 12 months?: Pt denies all substance use If attempted suicide, did drugs/alcohol play a role in this?: No Alcohol/Substance Abuse Treatment Hx: Denies past history Has alcohol/substance abuse ever caused legal problems?: No  Social Support System:   Heritage manager System: None Describe Community Support System: "Myself" Type of faith/religion: None How does patient's faith help to cope with current illness?: N/A  Leisure/Recreation:   Do You Have Hobbies?: Yes Leisure and Hobbies: Reading  Strengths/Needs:   What is the patient's  perception of their strengths?: "I'm not sure" Patient states they can use these personal strengths during  their treatment to contribute to their recovery: N/A Patient states these barriers may affect/interfere with their treatment: None Patient states these barriers may affect their return to the community: None Other important information patient would like considered in planning for their treatment: None  Discharge Plan:   Currently receiving community mental health services: No Patient states concerns and preferences for aftercare planning are: Pt is interested in therapy and medication management Patient states they will know when they are safe and ready for discharge when: "I don't know" Does patient have access to transportation?: Yes (Mother helps with transportation) Does patient have financial barriers related to discharge medications?: No Will patient be returning to same living situation after discharge?: Yes  Summary/Recommendations:   Summary and Recommendations (to be completed by the evaluator): Tammy Rosales is a 21 year old, female, who was admitted to the hospital due to worsening depression, suicidal thoughts, and substance use.  The Pt reports experiencing anxiety for several months prior to admission and reports having her first child 8 months ago.  The Pt is a poor historian and provides little information outside of answering yes or no to simple questions.  The Pt reports that her grandmother brought her to the hospital after going to the store in the middle of the night with her child.  She states that her radio broke and she wanted to replace it.  She denies any SI, depression, or AVH.  The Pt reports living with her mother and 21 month old daughter.  She states that she has a good relationship with her daughter but that sometimes it is difficult.  She reports no relationship with her biological father and reports that she was raised by her 79, Mother, and Grandmother  during her own childhood.  She reports no family stressors but denies that they are part of a support system for her.  She also reports few social supports. The Pt denies any childhood trauma.  She reports being unemployed and states that her mother helps her financally.  The Pt denies all substance use, as well as any current or previous substance use treatment.  The Pt's UDS was also negative for all substances.  While in the hospital the Pt can benefit from crisis stabilization, medication evaluation, group therapy, psycho-education, case management, and discharge planning.  Upon discharge the Pt would like to return home with her mother and follow-up with a local mental health provider for therapy and medication management.  Darleen Crocker. 12/23/2021

## 2021-12-23 NOTE — Group Note (Signed)
Recreation Therapy Group Note   Group Topic:Problem Solving  Group Date: 12/23/2021 Start Time: 0930 End Time: 3244 Facilitators: Caroll Rancher, LRT,CTRS Location: [address] Dayroom   Goal Area(s) Addresses:  Patient will effectively work with peer towards shared goal.  Patient will identify skills used to make activity successful.  Patient will identify how skills used during activity can be applied to reach post d/c goals.   Group Description: [employer]. In teams of 5-6, patients were given 25 small craft pipe cleaners. Using the materials provided, patients were instructed to compete again the opposing team(s) to build the tallest free-standing structure from floor level. The activity was timed; difficulty increased by Clinical research associate as Production designer, theatre/television/film continued.  Systematically resources were removed with additional directions for example, placing one arm behind their back, working in silence, and shape stipulations.   Affect/Mood: N/A   Participation Level: Did not attend    Clinical Observations/Individualized Feedback:     Plan: Continue to engage patient in RT group sessions 2-3x/week.   Caroll Rancher, LRT,CTRS 12/23/2021 1:10 PM

## 2021-12-23 NOTE — Group Note (Signed)
LCSW Group Therapy Note   Group Date: 12/23/2021 Start Time: 1300 End Time: 1400   Therapy Type: Group Therapy  Participation Level:  Did not attend   Description of Group: Patients received a worksheet with an outline of 2 faces. One side is designated for what the pt sees about themselves and the other is what others see. Pt's were asked to introduce themselves and share something they like about themself. Pts were then asked to draw, write or color how they view themselves as well as how they are viewed by others. CSW led discussion about the feelings and words associated with each side.   Patient Summary: Did not attend    Aram Beecham, LCSWA 12/23/2021  2:10 PM

## 2021-12-23 NOTE — H&P (Signed)
Psychiatric Admission Assessment Adult  Patient Identification: Tammy Rosales MRN:  [id number] Date of Evaluation:  12/23/2021 Chief Complaint:  Adjustment disorder with mixed anxiety and depressed mood [F43.23] Principal Diagnosis: Post partum depression Diagnosis:  Principal Problem:   Post partum depression  History of Present Illness:  Tammy Rosales is a 21 yr old female who presented to [facility] on 2/23 with her grandmother for symptoms of worsening depression, she was admitted to [facility] on 2/24.  Why she was in the hospital initially she did not answer.  Eventually she said because I need help.  When asked to elaborate what she meant by this she states her mother and grandmother think she needs help.  When asked what she needs help with she says "I do not know."  She states that she has been having issues since her daughter was born about 8 months ago.  The main complaint she will not report is decreased sleep and significant fatigue due to having to take care of her child.  She reports no previous diagnoses.  She reports no previous psychiatric hospitalizations.  She reports that she was recently started on an antidepressant but has no the name of it.  She reports no history of suicide attempts or self-injurious behaviors.  She reports no family psychiatric history.  She states she currently lives with her mother and her mother's children.  She states her daughter also lives with them.  She reports is not currently working.  She reports no alcohol use.  She reports no tobacco use.  She reports no illicit substance use.  (Per nursing report this morning patient did attend some college at [employer] simply so that she did not have to get a job).  She reports the following symptoms of depression: Decreased appetite, anxiety, and panic attacks. She reports no manic symptoms except for only sleeping 1 to 2 hours a night, however, this is in the context of taking care of  her child. When asked about auditory or visual hallucinations she states no (at this she then giggles while continuing to stare into the corner of the room). She reports no history of abuse.  Associated Signs/Symptoms: Depression Symptoms:  anxiety, panic attacks, decreased appetite, She reports fatigue and decreased sleep due to newborn Duration of Depression Symptoms: Greater than two weeks  (Hypo) Manic Symptoms:   Reports None Anxiety Symptoms:  Excessive Worry, Panic Symptoms, Psychotic Symptoms:   She reports no AVH but then begins giggling at this. PTSD Symptoms: NA Total Time spent with patient: 30 minutes  Past Psychiatric History: Post-Partum Depression  Is the patient at risk to self? Yes.    Has the patient been a risk to self in the past 6 months? No.  Has the patient been a risk to self within the distant past? No.  Is the patient a risk to others? No.  Has the patient been a risk to others in the past 6 months? No.  Has the patient been a risk to others within the distant past? No.   Prior Inpatient Therapy:  None Prior Outpatient Therapy:  None  Alcohol Screening: Patient refused Alcohol Screening Tool: Yes 1. How often do you have a drink containing alcohol?: Never 2. How many drinks containing alcohol do you have on a typical day when you are drinking?: 1 or 2 3. How often do you have six or more drinks on one occasion?: Never AUDIT-C Score: 0 4. How often during the last year have you found  that you were not able to stop drinking once you had started?: Never 5. How often during the last year have you failed to do what was normally expected from you because of drinking?: Never 6. How often during the last year have you needed a first drink in the morning to get yourself going after a heavy drinking session?: Never 7. How often during the last year have you had a feeling of guilt of remorse after drinking?: Never 8. How often during the last year have you been  unable to remember what happened the night before because you had been drinking?: Never 9. Have you or someone else been injured as a result of your drinking?: No 10. Has a relative or friend or a doctor or another health worker been concerned about your drinking or suggested you cut down?: No Alcohol Use Disorder Identification Test Final Score (AUDIT): 0 Substance Abuse History in the last 12 months:  No. Consequences of Substance Abuse: NA Previous Psychotropic Medications: No  Psychological Evaluations: No  Past Medical History:  Past Medical History:  Diagnosis Date   History of preterm labor 06/06/2021   SVD 03/2021 at 33 weeks   Medical history non-contributory     Past Surgical History:  Procedure Laterality Date   DILATION AND CURETTAGE OF UTERUS     NO PAST SURGERIES     Family History:  Family History  Problem Relation Age of Onset   Hypertension Maternal Aunt    Family Psychiatric  History: Reports no known Diagnosis', Substance Abuse, or Suicides. Tobacco Screening:   Social History:  Social History   Substance and Sexual Activity  Alcohol Use Not Currently     Social History   Substance and Sexual Activity  Drug Use Not Currently    Additional Social History: Marital status: Single Are you sexually active?: Yes What is your sexual orientation?: Heterosexual Has your sexual activity been affected by drugs, alcohol, medication, or emotional stress?: No Does patient have children?: Yes How many children?: 1 How is patient's relationship with their children?: "It's fine but it is difficult"                         Allergies:  No Known Allergies Lab Results:  Results for orders placed or performed during the hospital encounter of 12/22/21 (from the past 48 hour(s))  Resp Panel by RT-PCR (Flu A&B, Covid) Nasopharyngeal Swab     Status: None   Collection Time: 12/22/21 10:45 AM   Specimen: Nasopharyngeal Swab; Nasopharyngeal(NP) swabs in vial  transport medium  Result Value Ref Range   SARS Coronavirus 2 by RT PCR NEGATIVE NEGATIVE    Comment: (NOTE) SARS-CoV-2 target nucleic acids are NOT DETECTED.  The SARS-CoV-2 RNA is generally detectable in upper respiratory specimens during the acute phase of infection. The lowest concentration of SARS-CoV-2 viral copies this assay can detect is 138 copies/mL. A negative result does not preclude SARS-Cov-2 infection and should not be used as the sole basis for treatment or other patient management decisions. A negative result may occur with  improper specimen collection/handling, submission of specimen other than nasopharyngeal swab, presence of viral mutation(s) within the areas targeted by this assay, and inadequate number of viral copies(<138 copies/mL). A negative result must be combined with clinical observations, patient history, and epidemiological information. The expected result is Negative.  Fact Sheet for Patients:  EntrepreneurPulse.com.au  Fact Sheet for Healthcare Providers:  IncredibleEmployment.be  This test is no t  yet approved or cleared by the [place] and  has been authorized for detection and/or diagnosis of SARS-CoV-2 by FDA under an Emergency Use Authorization (EUA). This EUA will remain  in effect (meaning this test can be used) for the duration of the COVID-19 declaration under Section 564(b)(1) of the Act, 21 U.S.C.section 360bbb-3(b)(1), unless the authorization is terminated  or revoked sooner.       Influenza A by PCR NEGATIVE NEGATIVE   Influenza B by PCR NEGATIVE NEGATIVE    Comment: (NOTE) The Xpert Xpress SARS-CoV-2/FLU/RSV plus assay is intended as an aid in the diagnosis of influenza from Nasopharyngeal swab specimens and should not be used as a sole basis for treatment. Nasal washings and aspirates are unacceptable for Xpert Xpress SARS-CoV-2/FLU/RSV testing.  Fact Sheet for  Patients: EntrepreneurPulse.com.au  Fact Sheet for Healthcare Providers: IncredibleEmployment.be  This test is not yet approved or cleared by the [place] FDA and has been authorized for detection and/or diagnosis of SARS-CoV-2 by FDA under an Emergency Use Authorization (EUA). This EUA will remain in effect (meaning this test can be used) for the duration of the COVID-19 declaration under Section 564(b)(1) of the Act, 21 U.S.C. section 360bbb-3(b)(1), unless the authorization is terminated or revoked.  Performed at [facility], [place] [address], [place], [place] [address]   POCT Urine Drug Screen - (ICup)     Status: Normal   Collection Time: 12/22/21 10:45 AM  Result Value Ref Range   POC Amphetamine UR None Detected NONE DETECTED (Cut Off Level 1000 ng/mL)   POC Secobarbital (BAR) None Detected NONE DETECTED (Cut Off Level 300 ng/mL)   POC Buprenorphine (BUP) None Detected NONE DETECTED (Cut Off Level 10 ng/mL)   POC Oxazepam (BZO) None Detected NONE DETECTED (Cut Off Level 300 ng/mL)   POC Cocaine UR None Detected NONE DETECTED (Cut Off Level 300 ng/mL)   POC Methamphetamine UR None Detected NONE DETECTED (Cut Off Level 1000 ng/mL)   POC Morphine None Detected NONE DETECTED (Cut Off Level 300 ng/mL)   POC Oxycodone UR None Detected NONE DETECTED (Cut Off Level 100 ng/mL)   POC Methadone UR None Detected NONE DETECTED (Cut Off Level 300 ng/mL)   POC Marijuana UR None Detected NONE DETECTED (Cut Off Level 50 ng/mL)  POC SARS Coronavirus 2 Ag-ED - Urine, Clean Catch     Status: Normal   Collection Time: 12/22/21 10:45 AM  Result Value Ref Range   SARS Coronavirus 2 Ag Negative Negative  Pregnancy, urine POC     Status: None   Collection Time: 12/22/21 11:06 AM  Result Value Ref Range   Preg Test, Ur NEGATIVE NEGATIVE    Comment:        THE SENSITIVITY OF THIS METHODOLOGY IS >24 mIU/mL   CBC with Differential/Platelet     Status:  Abnormal   Collection Time: 12/22/21 12:19 PM  Result Value Ref Range   WBC 4.9 4.0 - 10.5 K/uL   RBC 4.51 3.87 - 5.11 MIL/uL   Hemoglobin 11.5 (L) 12.0 - 15.0 g/dL   HCT 35.7 (L) 36.0 - 46.0 %   MCV 79.2 (L) 80.0 - 100.0 fL   MCH 25.5 (L) 26.0 - 34.0 pg   MCHC 32.2 30.0 - 36.0 g/dL   RDW 14.6 11.5 - 15.5 %   Platelets 301 150 - 400 K/uL   nRBC 0.0 0.0 - 0.2 %   Neutrophils Relative % 36 %   Neutro Abs 1.8 1.7 -  7.7 K/uL   Lymphocytes Relative 50 %   Lymphs Abs 2.4 0.7 - 4.0 K/uL   Monocytes Relative 9 %   Monocytes Absolute 0.5 0.1 - 1.0 K/uL   Eosinophils Relative 5 %   Eosinophils Absolute 0.2 0.0 - 0.5 K/uL   Basophils Relative 0 %   Basophils Absolute 0.0 0.0 - 0.1 K/uL   Immature Granulocytes 0 %   Abs Immature Granulocytes 0.01 0.00 - 0.07 K/uL    Comment: Performed at [facility] [address] [place], [place] [address]  Comprehensive metabolic panel     Status: None   Collection Time: 12/22/21 12:19 PM  Result Value Ref Range   Sodium 140 135 - 145 mmol/L   Potassium 3.9 3.5 - 5.1 mmol/L   Chloride 106 98 - 111 mmol/L   CO2 27 22 - 32 mmol/L   Glucose, Bld 87 70 - 99 mg/dL    Comment: Glucose reference range applies only to samples taken after fasting for at least 8 hours.   BUN 9 6 - 20 mg/dL   Creatinine, Ser 0.63 0.44 - 1.00 mg/dL   Calcium 9.2 8.9 - 10.3 mg/dL   Total Protein 6.6 6.5 - 8.1 g/dL   Albumin 3.6 3.5 - 5.0 g/dL   AST 20 15 - 41 U/L   ALT 13 0 - 44 U/L   Alkaline Phosphatase 63 38 - 126 U/L   Total Bilirubin 0.3 0.3 - 1.2 mg/dL   GFR, Estimated >60 >60 mL/min    Comment: (NOTE) Calculated using the CKD-EPI Creatinine Equation (2021)    Anion gap 7 5 - 15    Comment: Performed at [facility] [address] [place], [place] [address]  Magnesium     Status: None   Collection Time: 12/22/21 12:19 PM  Result Value Ref Range   Magnesium 1.9 1.7 - 2.4 mg/dL    Comment: Performed at [facility] [address]  [place], [place] [address]  Prolactin     Status: None   Collection Time: 12/22/21 12:19 PM  Result Value Ref Range   Prolactin 15.7 4.8 - 23.3 ng/mL    Comment: (NOTE) Performed At: [facility] [place], [place] [id number] Rush Farmer MD [id number]   Hemoglobin A1c     Status: Abnormal   Collection Time: 12/22/21 12:21 PM  Result Value Ref Range   Hgb A1c MFr Bld 5.7 (H) 4.8 - 5.6 %    Comment: (NOTE)         Prediabetes: 5.7 - 6.4         Diabetes: >6.4         Glycemic control for adults with diabetes: <7.0    Mean Plasma Glucose 117 mg/dL    Comment: (NOTE) Performed At: [facility] [place], [place] [id number] Rush Farmer MD [id number]   Ethanol     Status: None   Collection Time: 12/22/21 12:21 PM  Result Value Ref Range   Alcohol, Ethyl (B) <10 <10 mg/dL    Comment: (NOTE) Lowest detectable limit for serum alcohol is 10 mg/dL.  For medical purposes only. Performed at [facility], [place] [address], [place], [place] [address]   Lipid panel     Status: Abnormal   Collection Time: 12/22/21 12:21 PM  Result Value Ref Range   Cholesterol 177 0 - 200 mg/dL   Triglycerides 43 <150 mg/dL   HDL 58 >40 mg/dL  Total CHOL/HDL Ratio 3.1 RATIO   VLDL 9 0 - 40 mg/dL   LDL Cholesterol 110 (H) 0 - 99 mg/dL    Comment:        Total Cholesterol/HDL:CHD Risk Coronary Heart Disease Risk Table                     Men   Women  1/2 Average Risk   3.4   3.3  Average Risk       5.0   4.4  2 X Average Risk   9.6   7.1  3 X Average Risk  23.4   11.0        Use the calculated Patient Ratio above and the CHD Risk Table to determine the patient's CHD Risk.        ATP III CLASSIFICATION (LDL):  <100     mg/dL   Optimal  100-129  mg/dL   Near or Above                    Optimal  130-159  mg/dL   Borderline  160-189  mg/dL   High  >190     mg/dL   Very High Performed at [facility] [address], [place],  [place] [id number]   TSH     Status: None   Collection Time: 12/22/21 12:22 PM  Result Value Ref Range   TSH 1.753 0.350 - 4.500 uIU/mL    Comment: Performed by a 3rd Generation assay with a functional sensitivity of <=0.01 uIU/mL. Performed at [facility], [place] [address] [place], [place] [address]   Pregnancy, urine     Status: None   Collection Time: 12/22/21 12:25 PM  Result Value Ref Range   Preg Test, Ur NEGATIVE NEGATIVE    Comment:        THE SENSITIVITY OF THIS METHODOLOGY IS >20 mIU/mL. Performed at [facility], [place] [address] [place],  [address]     Blood Alcohol level:  Lab Results  Component Value Date   ETH <10 99991111    Metabolic Disorder Labs:  Lab Results  Component Value Date   HGBA1C 5.7 (H) 12/22/2021   MPG 117 12/22/2021   Lab Results  Component Value Date   PROLACTIN 15.7 12/22/2021   Lab Results  Component Value Date   CHOL 177 12/22/2021   TRIG 43 12/22/2021   HDL 58 12/22/2021   CHOLHDL 3.1 12/22/2021   VLDL 9 12/22/2021   LDLCALC 110 (H) 12/22/2021    Current Medications: Current Facility-Administered Medications  Medication Dose Route Frequency Provider Last Rate Last Admin   acetaminophen (TYLENOL) tablet 650 mg  650 mg Oral Q6H PRN Lucky Rathke, FNP       alum & mag hydroxide-simeth (MAALOX/MYLANTA) 200-200-20 MG/5ML suspension 30 mL  30 mL Oral Q4H PRN Lucky Rathke, FNP       escitalopram (LEXAPRO) tablet 10 mg  10 mg Oral Daily Lucky Rathke, FNP   10 mg at 12/23/21 1034   hydrOXYzine (ATARAX) tablet 25 mg  25 mg Oral TID PRN Lucky Rathke, FNP       OLANZapine zydis (ZYPREXA) disintegrating tablet 5 mg  5 mg Oral Q8H PRN Briant Cedar, MD       And   LORazepam (ATIVAN) tablet 1 mg  1 mg Oral PRN Briant Cedar, MD       And   ziprasidone (GEODON)  injection 20 mg  20 mg Intramuscular PRN Lauro Franklin, MD       magnesium hydroxide (MILK OF MAGNESIA) suspension 30 mL  30 mL Oral Daily PRN  Lenard Lance, FNP       OLANZapine zydis (ZYPREXA) disintegrating tablet 5 mg  5 mg Oral QHS Lauro Franklin, MD       traZODone (DESYREL) tablet 50 mg  50 mg Oral QHS PRN Lenard Lance, FNP       PTA Medications: No medications prior to admission.    Musculoskeletal: Strength & Muscle Tone: within normal limits Gait & Station: normal Patient leans: N/A            Psychiatric Specialty Exam:  Presentation  General Appearance: Appropriate for Environment; Casual (in scrubs)  Eye Contact:None  Speech:Clear and Coherent; Normal Rate  Speech Volume:Decreased  Handedness:Right   Mood and Affect  Mood:Depressed  Affect:Depressed; Flat   Thought Process  Thought Processes:Coherent  Duration of Psychotic Symptoms: No data recorded Past Diagnosis of Schizophrenia or Psychoactive disorder: No  Descriptions of Associations:Intact  Orientation:Full (Time, Place and Person)  Thought Content:Logical  Hallucinations:Hallucinations: -- (She reports none but then giggles and continues to smile)  Ideas of Reference:None  Suicidal Thoughts:Suicidal Thoughts: No  Homicidal Thoughts:Homicidal Thoughts: No   Sensorium  Memory:Immediate Good; Recent Good  Judgment:Impaired  Insight:Shallow   Executive Functions  Concentration:Fair  Attention Span:Fair  Recall:Poor  Fund of Knowledge:Fair  Language:Fair   Psychomotor Activity  Psychomotor Activity:Psychomotor Activity: Normal   Assets  Assets:Communication Skills; Physical Health; Resilience; Social Support; Housing   Sleep  Sleep:Sleep: Good Number of Hours of Sleep: 6.25    Physical Exam: Physical Exam Vitals and nursing note reviewed.  Constitutional:      General: She is not in acute distress.    Appearance: Normal appearance. She is normal weight. She is not ill-appearing or toxic-appearing.  HENT:     Head: Normocephalic and atraumatic.  Pulmonary:     Effort: Pulmonary  effort is normal.  Musculoskeletal:        General: Normal range of motion.  Neurological:     General: No focal deficit present.     Mental Status: She is alert.   Review of Systems  Respiratory:  Negative for cough and shortness of breath.   Cardiovascular:  Negative for chest pain.  Gastrointestinal:  Negative for abdominal pain, constipation, diarrhea, nausea and vomiting.  Neurological:  Negative for dizziness, weakness and headaches.  Psychiatric/Behavioral:  Negative for hallucinations (she denies but laughs when asked) and suicidal ideas. The patient is not nervous/anxious.   Blood pressure 103/62, pulse 73, temperature 98 F (36.7 C), temperature source Oral, resp. rate 18, height 5\' 4"  (1.626 m), weight 57.6 kg, last menstrual period 11/30/2021, SpO2 100 %, not currently breastfeeding. Body mass index is 21.8 kg/m.  Treatment Plan Summary: Daily contact with patient to assess and evaluate symptoms and progress in treatment and Medication management  Tammy "Linzie Collin" Brashier is a 21 yr old female who presented to [facility] on 2/23 with her grandmother for symptoms of worsening depression, she was admitted to [facility] on 2/24.   Given her flat affect with history of social withdrawal and responding to internal stimuli there is significant concern Severe Post-Partum Depression or even Psychosis.  Due to this we will continue with her Lexapro but also start Zyprexa.  When able to she will be moved to the [address].  We will continue to monitor.  Severe Post-Partum Depression vs Post-Partum Psychosis: -Continue Lexapro 10 mg daily -Start Zyprexa 5 mg QHS -Start Agitation Protocol: Zyprexa/Ativan/Geodon   -Continue PRN's: Tylenol, Maalox, Atarax, Milk of Magnesia, Trazodone   Observation Level/Precautions:  15 minute checks  Laboratory:  CMP: WNL,  CBC: WNL except  Hem: 11.5,  HCT:35.7,  MCV:  79.2,  MCH: 25.5,  Mag: 1.9,  UDS: Neg,  Preg Urine: Neg,  TSH: 1.753,  Lipid Panel: WNL  except LDL: 110,  A1c: 5.7,  Prolactin: 15.7,  EtOH: Neg,  EKG: Sinus Bradycardia with Qtc: 438.  Psychotherapy:    Medications:  Lexapro, Zyprexa  Consultations:    Discharge Concerns:    Estimated LOS: 5-7 days  Other:     Physician Treatment Plan for Primary Diagnosis: Post partum depression Long Term Goal(s): Improvement in symptoms so as ready for discharge  Short Term Goals: Ability to identify changes in lifestyle to reduce recurrence of condition will improve, Ability to verbalize feelings will improve, Ability to identify and develop effective coping behaviors will improve, Ability to maintain clinical measurements within normal limits will improve, and Ability to identify triggers associated with substance abuse/mental health issues will improve  Physician Treatment Plan for Secondary Diagnosis: Principal Problem:   Post partum depression  Long Term Goal(s): Improvement in symptoms so as ready for discharge  Short Term Goals: Ability to identify changes in lifestyle to reduce recurrence of condition will improve, Ability to verbalize feelings will improve, Ability to identify and develop effective coping behaviors will improve, Ability to maintain clinical measurements within normal limits will improve, and Ability to identify triggers associated with substance abuse/mental health issues will improve  I certify that inpatient services furnished can reasonably be expected to improve the patient's condition.    Briant Cedar, MD 2/24/20235:20 PM

## 2021-12-23 NOTE — BHH Suicide Risk Assessment (Signed)
Suicide Risk Assessment  Admission Assessment    [facility] Admission Suicide Risk Assessment   Nursing information obtained from:  Patient Demographic factors:  Adolescent or young adult, Unemployed, Low socioeconomic status Current Mental Status:  NA Loss Factors:  Financial problems / change in socioeconomic status (transportation) Historical Factors:  Impulsivity Risk Reduction Factors:  Positive social support, Sense of responsibility to family, Living with another person, especially a relative  Total Time spent with patient: 30 minutes Principal Problem: Post partum depression Diagnosis:  Principal Problem:   Post partum depression  Subjective Data:  Tammy "Tammy Rosales is a 21 yr old female who presented to [facility] on 2/23 with her grandmother for symptoms of worsening depression, she was admitted to [facility] on 2/24.   Why she was in the hospital initially she did not answer.  Eventually she said because I need help.  When asked to elaborate what she meant by this she states her mother and grandmother think she needs help.  When asked what she needs help with she says "I do not know."  She states that she has been having issues since her daughter was born about 8 months ago.  The main complaint she will not report is decreased sleep and significant fatigue due to having to take care of her child.  She reports no previous diagnoses.  She reports no previous psychiatric hospitalizations.  She reports that she was recently started on an antidepressant but has no the name of it.  She reports no history of suicide attempts or self-injurious behaviors.  She reports no family psychiatric history.  She states she currently lives with her mother and her mother's children.  She states her daughter also lives with them.  She reports is not currently working.  She reports no alcohol use.  She reports no tobacco use.  She reports no illicit substance use.  (Per nursing report this morning patient did  attend some college at [employer] simply so that she did not have to get a job).  She reports the following symptoms of depression: Decreased appetite, anxiety, and panic attacks. She reports no manic symptoms except for only sleeping 1 to 2 hours a night, however, this is in the context of taking care of her child. When asked about auditory or visual hallucinations she states no (at this she then giggles while continuing to stare into the corner of the room). She reports no history of abuse.  Continued Clinical Symptoms:  Alcohol Use Disorder Identification Test Final Score (AUDIT): 0 The "Alcohol Use Disorders Identification Test", Guidelines for Use in Primary Care, Second Edition.  World Pharmacologist [facility]). Score between 0-7:  no or low risk or alcohol related problems. Score between 8-15:  moderate risk of alcohol related problems. Score between 16-19:  high risk of alcohol related problems. Score 20 or above:  warrants further diagnostic evaluation for alcohol dependence and treatment.   CLINICAL FACTORS:   Postpartum Depression Possible psychosis   Musculoskeletal: Strength & Muscle Tone: within normal limits Gait & Station: normal Patient leans: N/A  Psychiatric Specialty Exam:  Presentation  General Appearance: Appropriate for Environment; Casual (in scrubs)  Eye Contact:None  Speech:Clear and Coherent; Normal Rate  Speech Volume:Decreased  Handedness:Right   Mood and Affect  Mood:Depressed  Affect:Depressed; Flat   Thought Process  Thought Processes:Coherent  Descriptions of Associations:Intact  Orientation:Full (Time, Place and Person)  Thought Content:Logical  History of Schizophrenia/Schizoaffective disorder:No  Duration of Psychotic Symptoms:No data recorded Hallucinations:Hallucinations: -- (She  reports none but then giggles and continues to smile)  Ideas of Reference:None  Suicidal Thoughts:Suicidal Thoughts:  No  Homicidal Thoughts:Homicidal Thoughts: No   Sensorium  Memory:Immediate Good; Recent Good  Judgment:Impaired  Insight:Shallow   Executive Functions  Concentration:Fair  Attention Span:Fair  [place]   Psychomotor Activity  Psychomotor Activity:Psychomotor Activity: Normal   Assets  Assets:Communication Skills; Physical Health; Resilience; Social Support; Housing   Sleep  Sleep:Sleep: Good Number of Hours of Sleep: 6.25    Physical Exam: Physical Exam Vitals and nursing note reviewed.  Constitutional:      General: She is not in acute distress.    Appearance: Normal appearance. She is normal weight. She is not ill-appearing or toxic-appearing.  HENT:     Head: Normocephalic and atraumatic.  Pulmonary:     Effort: Pulmonary effort is normal.  Musculoskeletal:        General: Normal range of motion.  Neurological:     General: No focal deficit present.     Mental Status: She is alert.   Review of Systems  Respiratory:  Negative for cough and shortness of breath.   Cardiovascular:  Negative for chest pain.  Gastrointestinal:  Negative for abdominal pain, constipation, diarrhea, nausea and vomiting.  Neurological:  Negative for dizziness, weakness and headaches.  Psychiatric/Behavioral:  Negative for depression, hallucinations (reports no but then giggles) and suicidal ideas. The patient is not nervous/anxious.   Blood pressure 103/62, pulse 73, temperature 98 F (36.7 C), temperature source Oral, resp. rate 18, height 5\' 4"  (1.626 m), weight 57.6 kg, last menstrual period 11/30/2021, SpO2 100 %, not currently breastfeeding. Body mass index is 21.8 kg/m.   COGNITIVE FEATURES THAT CONTRIBUTE TO RISK:  Loss of executive function and Thought constriction (tunnel vision)    SUICIDE RISK:   Mild:  Suicidal ideation of limited frequency, intensity, duration, and specificity.  There are no identifiable plans, no  associated intent, mild dysphoria and related symptoms, good self-control (both objective and subjective assessment), few other risk factors, and identifiable protective factors, including available and accessible social support.  PLAN OF CARE:  Tammy Rosales is a 21 yr old female who presented to [facility] on 2/23 with her grandmother for symptoms of worsening depression, she was admitted to [facility] on 2/24.     Given her flat affect with history of social withdrawal and responding to internal stimuli there is significant concern Severe Post-Partum Depression or even Psychosis.  Due to this we will continue with her Lexapro but also start Zyprexa.  When able to she will be moved to the [facility].  We will continue to monitor.     Severe Post-Partum Depression vs Post-Partum Psychosis: -Continue Lexapro 10 mg daily -Start Zyprexa 5 mg QHS -Start Agitation Protocol: Zyprexa/Ativan/Geodon     -Continue PRN's: Tylenol, Maalox, Atarax, Milk of Magnesia, Trazodone  I certify that inpatient services furnished can reasonably be expected to improve the patient's condition.   Briant Cedar, MD 12/23/2021, 5:23 PM

## 2021-12-23 NOTE — BH IP Treatment Plan (Signed)
Interdisciplinary Treatment and Diagnostic Plan Update  12/23/2021 Kammy Klett MRN: [id number]  Principal Diagnosis: Adjustment disorder with mixed anxiety and depressed mood  Secondary Diagnoses: Principal Problem:   Adjustment disorder with mixed anxiety and depressed mood   Current Medications:  Current Facility-Administered Medications  Medication Dose Route Frequency Provider Last Rate Last Admin   acetaminophen (TYLENOL) tablet 650 mg  650 mg Oral Q6H PRN Lenard Lance, FNP       alum & mag hydroxide-simeth (MAALOX/MYLANTA) 200-200-20 MG/5ML suspension 30 mL  30 mL Oral Q4H PRN Lenard Lance, FNP       escitalopram (LEXAPRO) tablet 10 mg  10 mg Oral Daily Lenard Lance, FNP   10 mg at 12/23/21 1034   hydrOXYzine (ATARAX) tablet 25 mg  25 mg Oral TID PRN Lenard Lance, FNP       magnesium hydroxide (MILK OF MAGNESIA) suspension 30 mL  30 mL Oral Daily PRN Lenard Lance, FNP       traZODone (DESYREL) tablet 50 mg  50 mg Oral QHS PRN Lenard Lance, FNP       PTA Medications: No medications prior to admission.    Patient Stressors: Financial difficulties   Other: Transportation Needs    Patient Strengths: Capable of independent living  Communication skills  Physical Health  Supportive family/friends   Treatment Modalities: Medication Management, Group therapy, Case management,  1 to 1 session with clinician, Psychoeducation, Recreational therapy.   Physician Treatment Plan for Primary Diagnosis: Adjustment disorder with mixed anxiety and depressed mood Long Term Goal(s):     Short Term Goals:    Medication Management: Evaluate patient's response, side effects, and tolerance of medication regimen.  Therapeutic Interventions: 1 to 1 sessions, Unit Group sessions and Medication administration.  Evaluation of Outcomes: Progressing  Physician Treatment Plan for Secondary Diagnosis: Principal Problem:   Adjustment disorder with mixed anxiety and depressed mood  Long  Term Goal(s):     Short Term Goals:       Medication Management: Evaluate patient's response, side effects, and tolerance of medication regimen.  Therapeutic Interventions: 1 to 1 sessions, Unit Group sessions and Medication administration.  Evaluation of Outcomes: Progressing   RN Treatment Plan for Primary Diagnosis: Adjustment disorder with mixed anxiety and depressed mood Long Term Goal(s): Knowledge of disease and therapeutic regimen to maintain health will improve  Short Term Goals: Ability to verbalize feelings will improve, Ability to identify and develop effective coping behaviors will improve, and Compliance with prescribed medications will improve  Medication Management: RN will administer medications as ordered by provider, will assess and evaluate patient's response and provide education to patient for prescribed medication. RN will report any adverse and/or side effects to prescribing provider.  Therapeutic Interventions: 1 on 1 counseling sessions, Psychoeducation, Medication administration, Evaluate responses to treatment, Monitor vital signs and CBGs as ordered, Perform/monitor CIWA, COWS, AIMS and Fall Risk screenings as ordered, Perform wound care treatments as ordered.  Evaluation of Outcomes: Progressing   LCSW Treatment Plan for Primary Diagnosis: Adjustment disorder with mixed anxiety and depressed mood Long Term Goal(s): Safe transition to appropriate next level of care at discharge, Engage patient in therapeutic group addressing interpersonal concerns.  Short Term Goals: Engage patient in aftercare planning with referrals and resources, Increase social support, and Increase ability to appropriately verbalize feelings  Therapeutic Interventions: Assess for all discharge needs, 1 to 1 time with Social worker, Explore available resources and support systems, Assess for adequacy in community support  network, Educate family and significant other(s) on suicide  prevention, Complete Psychosocial Assessment, Interpersonal group therapy.  Evaluation of Outcomes: Progressing   Progress in Treatment: Attending groups: Yes. Participating in groups: Yes. Taking medication as prescribed: Yes. Toleration medication: Yes. Family/Significant other contact made: No, will contact:  mother Patient understands diagnosis: Yes. Discussing patient identified problems/goals with staff: Yes. Medical problems stabilized or resolved: Yes. Denies suicidal/homicidal ideation: Yes. Issues/concerns per patient self-inventory: No. Other: None  New problem(s) identified: No, Describe:  None  New Short Term/Long Term Goal(s):medication stabilization, elimination of SI thoughts, development of comprehensive mental wellness plan.   Patient Goals:  "I have no goal"  Discharge Plan or Barriers: Patient recently admitted. CSW will continue to follow and assess for appropriate referrals and possible discharge planning.   Reason for Continuation of Hospitalization: Delusions  Hallucinations Medication stabilization  Estimated Length of Stay: 3-5 days   Scribe for Treatment Team: Chrys Racer 12/23/2021 3:25 PM

## 2021-12-23 NOTE — BHH Group Notes (Signed)
Pt didn't attend goals group 

## 2021-12-24 MED ORDER — OLANZAPINE 10 MG PO TBDP
10.0000 mg | ORAL_TABLET | Freq: Every day | ORAL | Status: DC
  Administered 2021-12-24: 10 mg via ORAL
  Filled 2021-12-24 (×4): qty 1

## 2021-12-24 NOTE — Progress Notes (Signed)
Dar Note:  Patient presents with flat affect and depressed mood.  Denies suicidal thoughts, auditory and visual hallucinations.  Medication given as prescribed.  Routine safety checks maintained.  Refused to attend group when invited.  Patient is withdrawn and isolative to her room.  Support end encouragement offered.  Patient is safe on the unit.

## 2021-12-24 NOTE — Progress Notes (Signed)
Pt had a visitor during visitation time but declined to go see the visitor.

## 2021-12-24 NOTE — Progress Notes (Addendum)
[facility] MD Progress Note  12/24/2021 5:32 PM Tammy Rosales  MRN:  [id number] Subjective:  Tammy "Timoteo Expose" Edley is a 21 yr old female who presented to [facility] on 2/23 with her grandmother for symptoms of worsening depression, she was admitted to [facility] on 2/24.   Patient is seen in her room this morning.  Patient is guarded and interacts minimally.  She has been isolating in her room and did not go to groups this morning.  She has minimal eye contact and answers questions in 1 or 2 words or " yes" or "no".  She stares at the wall constantly.  She has a flat affect.  Patient is sleeping on the bench next to two beds. The Sheets on the beds are untouched and neat.  When asked why she is sleeping on the bench and not on the bed.  She states "I don't know". When asked if she likes sleeping on the bench, she started staring at the wall . Patient states her mood is good and denies any depression or anxiety.  Currently, she denies any active or passive suicidal ideations, homicidal ideations, auditory and visual hallucinations. She denies any paranoia.  When asked why she is here, she states her mom brought her here.  When asked if she was [place] at the highway [place] her [place], she states "no".  She does not remember what happened before she came to the hospital.  When I asked her if she misses her daughter , she states "no".  She reports sleeping well last night and has good appetite.  She states she ate pancakes in breakfast this morning.  She has not talked to her mom since she is here.  When asked about patient's hobbies, patient states she is neat freak.  She feels like a compulsion to clean but does not get stressed out from that.  When asked about coping skills, she states reading, sleeping.  Encouraged to attend groups.  Patient stares at the wall. She denies any side effects from medications and has been tolerating well.  Principal Problem: Post partum depression Diagnosis: Principal Problem:   Post partum  depression  Total Time spent with patient: 20 minutes  Past Psychiatric History: See H&P  Past Medical History:  Past Medical History:  Diagnosis Date   History of preterm labor 06/06/2021   SVD 03/2021 at 33 weeks   Medical history non-contributory     Past Surgical History:  Procedure Laterality Date   DILATION AND CURETTAGE OF UTERUS     NO PAST SURGERIES     Family History:  Family History  Problem Relation Age of Onset   Hypertension Maternal Aunt    Family Psychiatric  History: See H&P Social History:  Social History   Substance and Sexual Activity  Alcohol Use Not Currently     Social History   Substance and Sexual Activity  Drug Use Not Currently    Social History   Socioeconomic History   Marital status: Single    Spouse name: Not on file   Number of children: Not on file   Years of education: Not on file   Highest education level: Not on file  Occupational History   Not on file  Tobacco Use   Smoking status: Never   Smokeless tobacco: Never  Vaping Use   Vaping Use: Never used  Substance and Sexual Activity   Alcohol use: Not Currently   Drug use: Not Currently   Sexual activity: Not Currently  Other Topics Concern  Not on file  Social History Narrative   Not on file   Social Determinants of Health   Financial Resource Strain: Not on file  Food Insecurity: Food Insecurity Present   Worried About [place] in the Last Year: Sometimes true   Ran Out of Food in the Last Year: Sometimes true  Transportation Needs: Public librarian (Medical): Yes   Lack of Transportation (Non-Medical): Yes  Physical Activity: Not on file  Stress: Not on file  Social Connections: Not on file   Additional Social History:                         Sleep: Good  Appetite:  Good  Current Medications: Current Facility-Administered Medications  Medication Dose Route Frequency Provider Last Rate Last Admin    acetaminophen (TYLENOL) tablet 650 mg  650 mg Oral Q6H PRN Lucky Rathke, FNP       alum & mag hydroxide-simeth (MAALOX/MYLANTA) 200-200-20 MG/5ML suspension 30 mL  30 mL Oral Q4H PRN Lucky Rathke, FNP       escitalopram (LEXAPRO) tablet 10 mg  10 mg Oral Daily Lucky Rathke, FNP   10 mg at 12/24/21 0802   hydrOXYzine (ATARAX) tablet 25 mg  25 mg Oral TID PRN Lucky Rathke, FNP       OLANZapine zydis (ZYPREXA) disintegrating tablet 5 mg  5 mg Oral Q8H PRN Pashayan, Redgie Grayer, MD       And   LORazepam (ATIVAN) tablet 1 mg  1 mg Oral PRN Briant Cedar, MD       And   ziprasidone (GEODON) injection 20 mg  20 mg Intramuscular PRN Briant Cedar, MD       magnesium hydroxide (MILK OF MAGNESIA) suspension 30 mL  30 mL Oral Daily PRN Lucky Rathke, FNP       OLANZapine zydis (ZYPREXA) disintegrating tablet 10 mg  10 mg Oral QHS Armando Reichert, MD       traZODone (DESYREL) tablet 50 mg  50 mg Oral QHS PRN Lucky Rathke, FNP        Lab Results: No results found for this or any previous visit (from the past 48 hour(s)).  Blood Alcohol level:  Lab Results  Component Value Date   ETH <10 99991111    Metabolic Disorder Labs: Lab Results  Component Value Date   HGBA1C 5.7 (H) 12/22/2021   MPG 117 12/22/2021   Lab Results  Component Value Date   PROLACTIN 15.7 12/22/2021   Lab Results  Component Value Date   CHOL 177 12/22/2021   TRIG 43 12/22/2021   HDL 58 12/22/2021   CHOLHDL 3.1 12/22/2021   VLDL 9 12/22/2021   LDLCALC 110 (H) 12/22/2021    Physical Findings: AIMS: Facial and Oral Movements Muscles of Facial Expression: None, normal Lips and Perioral Area: None, normal Jaw: None, normal Tongue: None, normal,Extremity Movements Upper (arms, wrists, hands, fingers): None, normal Lower (legs, knees, ankles, toes): None, normal, Trunk Movements Neck, shoulders, hips: None, normal, Overall Severity Severity of abnormal movements (highest score from questions  above): None, normal Incapacitation due to abnormal movements: None, normal Patient's awareness of abnormal movements (rate only patient's report): No Awareness, Dental Status Current problems with teeth and/or dentures?: No Does patient usually wear dentures?: No  CIWA:    COWS:     Musculoskeletal: Strength & Muscle Tone: within normal limits Gait &  Station: normal Patient leans: N/A  Psychiatric Specialty Exam:  Presentation  General Appearance: Appropriate for Environment  Eye Contact:None  Speech:Blocked (answers questions in 1 or2 words. Yes or no)  Speech Volume:Decreased  Handedness:Right   Mood and Affect  Mood:Euthymic  Affect:Non-Congruent; Flat   Thought Process  Thought Processes:Coherent  Descriptions of Associations:Intact  Orientation:Full (Time, Place and Person)  Thought Content:-- (Denies SI, HI, AVH. Limited interaction)  History of Schizophrenia/Schizoaffective disorder:No  Duration of Psychotic Symptoms:No data recorded Hallucinations:Hallucinations: None  Ideas of Reference:None  Suicidal Thoughts:Suicidal Thoughts: No  Homicidal Thoughts:Homicidal Thoughts: No   Sensorium  Memory:Immediate Fair; Recent Poor (Doesn't rememeber that she was looking on the highway)  Judgment:Impaired  Insight:Lacking   Executive Functions  Concentration:Fair  Attention Span:Fair  Recall:Poor  Fund of Knowledge:-- (Limited interaction)  Language:Poor   Psychomotor Activity  Psychomotor Activity:Psychomotor Activity: Decreased   Assets  Assets:Physical Health; Social Support; Housing; Catering manager   Sleep  Sleep:Sleep: Good Number of Hours of Sleep: 6.25    Physical Exam: Physical Exam Vitals and nursing note reviewed.  Constitutional:      General: She is not in acute distress.    Appearance: Normal appearance. She is not ill-appearing, toxic-appearing or diaphoretic.  HENT:     Head: Normocephalic.   Neurological:     Mental Status: She is alert.   Review of Systems  Psychiatric/Behavioral:  Negative for depression, hallucinations and suicidal ideas. The patient is not nervous/anxious and does not have insomnia.   Blood pressure 105/77, pulse 64, temperature 98.1 F (36.7 C), temperature source Oral, resp. rate 18, height 5\' 4"  (1.626 m), weight 57.6 kg, last menstrual period 11/30/2021, SpO2 100 %, not currently breastfeeding. Body mass index is 21.8 kg/m.   Treatment Plan Summary: Daily contact with patient to assess and evaluate symptoms and progress in treatment Tammy Rosales is a 21 yr old female who presented to [facility] on 2/23 with her grandmother for symptoms of worsening depression, she was admitted to [facility] on 2/24.     Given her flat affect with history of social withdrawal and responding to internal stimuli there is significant concern Severe Post-Partum Depression or even Psychosis.  Due to this Lexapro was continued and Zyprexa was started.  When able to she will be moved to the [facility].  We will continue to monitor.    Severe Post-Partum Depression vs Post-Partum Psychosis: -Continue Lexapro 10 mg daily -Increase Zyprexa to 10 mg QHS for psychosis.  -Continue agitation Protocol: Zyprexa/Ativan/Geodon     -Continue PRN's: Tylenol, Maalox, Atarax, Milk of Magnesia, Trazodone   Armando Reichert, MD PGY2 12/24/2021, 5:32 PM

## 2021-12-24 NOTE — Group Note (Signed)
LCSW Group Therapy ° ° °Due to high patient acuity and staffing, group was unable to be held on 12/24/2021. The CSW supervisor as well as the on-duty AC was made aware. ° °Demont Linford T Winona Sison LCSWA  °2:44 PM ° °

## 2021-12-24 NOTE — Progress Notes (Signed)
Pt largely non-verbal, did take medication when brought to room, no answer to questions.      12/24/21 0047  Psych Admission Type (Psych Patients Only)  Admission Status Voluntary  Psychosocial Assessment  Patient Complaints Depression  Eye Contact Avoids  Facial Expression Flat  Affect Blunted  Speech Soft  Interaction Minimal  Motor Activity Slow  Appearance/Hygiene Disheveled  Behavior Characteristics Guarded  Mood Depressed;Preoccupied  Thought Process  Coherency Blocking  Content UTA  Delusions UTA  Perception UTA  Hallucination UTA  Judgment Impaired  Confusion UTA  Danger to Self  Current suicidal ideation? Denies  Danger to Others  Danger to Others None reported or observed

## 2021-12-24 NOTE — Progress Notes (Signed)
Psychoeducational Group Note  Date:  12/24/2021 Time:  2015  Group Topic/Focus:  Wrap up group  Participation Level: Did Not Attend  Participation Quality:  Not Applicable  Affect:  Not Applicable  Cognitive:  Not Applicable  Insight:  Not Applicable  Engagement in Group: Not Applicable  Additional Comments:  Did not attend.   Johann Capers S 12/24/2021, 9:07 PM

## 2021-12-24 NOTE — Progress Notes (Signed)
Pt remains largely non-verbal, will selectively answer "yes" or "no".  Pt did take night medication once it was brought to her room.      12/24/21 2247  Psych Admission Type (Psych Patients Only)  Admission Status Voluntary  Psychosocial Assessment  Patient Complaints Depression  Eye Contact Brief  Facial Expression Flat  Affect Blunted  Speech Soft  Interaction Isolative;Minimal  Motor Activity Slow  Appearance/Hygiene Disheveled  Behavior Characteristics Guarded  Mood Depressed  Thought Process  Coherency Blocking  Content WDL  Delusions UTA  Perception UTA  Hallucination UTA  Judgment Impaired  Confusion UTA  Danger to Self  Current suicidal ideation? Denies  Danger to Others  Danger to Others None reported or observed

## 2021-12-24 NOTE — [facility] Group Notes (Signed)
.  Psychoeducational Group Note ° ° ° °Date:12/24/21 °Time: 1300-1400 ° ° ° °Purpose of Group: . The group focus' on teaching patients on how to identify their needs and their Life Skills:  A group where two lists are made. What people need and what are things that we do that are unhealthy. The lists are developed by the patients and it is explained that we often do the actions that are not healthy to get our list of needs met. ° °Goal:: to develop the coping skills needed to get their needs met ° °Participation Level:  did not attend °Laconda Basich A ° °

## 2021-12-24 NOTE — [facility] Group Notes (Signed)
Goals Group °12/24/2021 ° ° °Group Focus: affirmation, clarity of thought, and goals/reality orientation °Treatment Modality:  Psychoeducation °Interventions utilized were assignment, group exercise, and support °Purpose: To be able to understand and verbalize the reason for their admission to the hospital. To understand that the medication helps with their chemical imbalance but they also need to work on their choices in life. To be challenged to develop Rosales list of 30 positives about themselves. Also introduce the concept that "feelings" are not reality. ° °Participation Level:  did not attend °Tammy Rosales °

## 2021-12-25 LAB — GLUCOSE, CAPILLARY: Glucose-Capillary: 82 mg/dL (ref 70–99)

## 2021-12-25 MED ORDER — ZIPRASIDONE HCL 20 MG PO CAPS
20.0000 mg | ORAL_CAPSULE | Freq: Two times a day (BID) | ORAL | Status: DC
  Administered 2021-12-26 – 2021-12-28 (×5): 20 mg via ORAL
  Filled 2021-12-25 (×12): qty 1

## 2021-12-25 MED ORDER — TAB-A-VITE/IRON PO TABS
1.0000 | ORAL_TABLET | Freq: Every day | ORAL | Status: DC
  Administered 2021-12-25 – 2022-01-01 (×8): 1 via ORAL
  Filled 2021-12-25 (×11): qty 1

## 2021-12-25 MED ORDER — VITAMIN D3 25 MCG PO TABS
1000.0000 [IU] | ORAL_TABLET | Freq: Every day | ORAL | Status: DC
  Administered 2021-12-25 – 2022-01-01 (×8): 1000 [IU] via ORAL
  Filled 2021-12-25 (×11): qty 1

## 2021-12-25 MED ORDER — ENSURE ENLIVE PO LIQD
237.0000 mL | Freq: Two times a day (BID) | ORAL | Status: DC
  Administered 2021-12-25 – 2022-01-01 (×12): 237 mL via ORAL
  Filled 2021-12-25 (×18): qty 237

## 2021-12-25 NOTE — Progress Notes (Signed)
[facility] MD Progress Note  12/25/2021 3:13 PM Tammy Rosales  MRN:  [id number] Subjective:  Tammy Rosales is a 21 yr old female who presented to [facility] on 2/23 with her grandmother for symptoms of worsening depression, she was admitted to [facility] on 2/24.   Chart review from last 24 hours-The patient's chart was reviewed and nursing notes were reviewed. Patient discussed in progression rounds with treatment team. MAR was reviewed and Pt is complaint  with scheduled medications and did not required any PRN medications.    Patient is seen in her room this morning.  Patient is guarded and interacts minimally.  Patient is hair are unkempt.  She is wearing her scrub shirt with back in the front and keeping her hands inside her scrub.  She states she is feeling cold.  She has been isolating in her room and has not been going to groups this morning.  She has minimal eye contact and answers questions in 1 or 2 words or " yes" or "no".  She has flat affect and stares at the wall sometimes.  Patient again sleeping on the bench next to two beds.  Patient reports good mood and denies any depression or anxiety.  Currently, she denies any active or passive suicidal ideations, homicidal ideations, auditory and visual hallucinations. She denies any paranoia.  She denies any thought insertion/deletion, thought broadcasting and ideas of reference.  When asked if she misses her daughter, she says "yes".  she reports sleeping well last night and reports ok appetite.  She did not eat her breakfast this morning.  She has not talked to her mom since she is here.  Encouraged to attend groups.  She denies any physical complaints and has been tolerating her medications well.  Patient asks if she can go home.  Discussed that we are not planning to discharge her soon.  Principal Problem: Post partum depression Diagnosis: Principal Problem:   Post partum depression  Total Time spent with patient: 20 minutes  Past Psychiatric  History: See H&P  Past Medical History:  Past Medical History:  Diagnosis Date   History of preterm labor 06/06/2021   SVD 03/2021 at 33 weeks   Medical history non-contributory     Past Surgical History:  Procedure Laterality Date   DILATION AND CURETTAGE OF UTERUS     NO PAST SURGERIES     Family History:  Family History  Problem Relation Age of Onset   Hypertension Maternal Aunt    Family Psychiatric  History: See H&P Social History:  Social History   Substance and Sexual Activity  Alcohol Use Not Currently     Social History   Substance and Sexual Activity  Drug Use Not Currently    Social History   Socioeconomic History   Marital status: Single    Spouse name: Not on file   Number of children: Not on file   Years of education: Not on file   Highest education level: Not on file  Occupational History   Not on file  Tobacco Use   Smoking status: Never   Smokeless tobacco: Never  Vaping Use   Vaping Use: Never used  Substance and Sexual Activity   Alcohol use: Not Currently   Drug use: Not Currently   Sexual activity: Not Currently  Other Topics Concern   Not on file  Social History Narrative   Not on file   Social Determinants of Health   Financial Resource Strain: Not on file  Food Insecurity:  Food Insecurity Present   Worried About Charity fundraiser in the Last Year: Sometimes true   Ran Out of Food in the Last Year: Sometimes true  Transportation Needs: Public librarian (Medical): Yes   Lack of Transportation (Non-Medical): Yes  Physical Activity: Not on file  Stress: Not on file  Social Connections: Not on file   Additional Social History:                         Sleep: Good  Appetite:  Good  Current Medications: Current Facility-Administered Medications  Medication Dose Route Frequency Provider Last Rate Last Admin   acetaminophen (TYLENOL) tablet 650 mg  650 mg Oral Q6H PRN Lucky Rathke,  FNP       alum & mag hydroxide-simeth (MAALOX/MYLANTA) 200-200-20 MG/5ML suspension 30 mL  30 mL Oral Q4H PRN Lucky Rathke, FNP       escitalopram (LEXAPRO) tablet 10 mg  10 mg Oral Daily Lucky Rathke, FNP   10 mg at 12/25/21 0804   feeding supplement (ENSURE ENLIVE / ENSURE PLUS) liquid 237 mL  237 mL Oral BID BM Armando Reichert, MD       hydrOXYzine (ATARAX) tablet 25 mg  25 mg Oral TID PRN Lucky Rathke, FNP       OLANZapine zydis (ZYPREXA) disintegrating tablet 5 mg  5 mg Oral Q8H PRN Pashayan, Redgie Grayer, MD       And   LORazepam (ATIVAN) tablet 1 mg  1 mg Oral PRN Briant Cedar, MD       And   ziprasidone (GEODON) injection 20 mg  20 mg Intramuscular PRN Briant Cedar, MD       magnesium hydroxide (MILK OF MAGNESIA) suspension 30 mL  30 mL Oral Daily PRN Lucky Rathke, FNP       multivitamins with iron tablet 1 tablet  1 tablet Oral Daily Armando Reichert, MD   1 tablet at 12/25/21 1028   traZODone (DESYREL) tablet 50 mg  50 mg Oral QHS PRN Lucky Rathke, FNP       Vitamin D3 (Vitamin D) tablet 1,000 Units  1,000 Units Oral Daily Armando Reichert, MD   1,000 Units at 12/25/21 1028   ziprasidone (GEODON) capsule 20 mg  20 mg Oral BID WC Armando Reichert, MD        Lab Results: No results found for this or any previous visit (from the past 13 hour(s)).  Blood Alcohol level:  Lab Results  Component Value Date   ETH <10 99991111    Metabolic Disorder Labs: Lab Results  Component Value Date   HGBA1C 5.7 (H) 12/22/2021   MPG 117 12/22/2021   Lab Results  Component Value Date   PROLACTIN 15.7 12/22/2021   Lab Results  Component Value Date   CHOL 177 12/22/2021   TRIG 43 12/22/2021   HDL 58 12/22/2021   CHOLHDL 3.1 12/22/2021   VLDL 9 12/22/2021   LDLCALC 110 (H) 12/22/2021    Physical Findings: AIMS: Facial and Oral Movements Muscles of Facial Expression: None, normal Lips and Perioral Area: None, normal Jaw: None, normal Tongue: None, normal,Extremity  Movements Upper (arms, wrists, hands, fingers): None, normal Lower (legs, knees, ankles, toes): None, normal, Trunk Movements Neck, shoulders, hips: None, normal, Overall Severity Severity of abnormal movements (highest score from questions above): None, normal Incapacitation due to abnormal movements: None, normal Patient's awareness of abnormal  movements (rate only patient's report): No Awareness, Dental Status Current problems with teeth and/or dentures?: No Does patient usually wear dentures?: No  CIWA:    COWS:     Musculoskeletal: Strength & Muscle Tone: within normal limits Gait & Station: normal Patient leans: N/A  Psychiatric Specialty Exam:  Presentation  General Appearance: Disheveled; Other (comment) (She is wearing her scrub shirt opposite with back in the front and keeping her hands inside her scrub.)  Eye Contact:Poor  Speech:Blocked (answers questions in 1 or 2 words or " yes" or "no".)  Speech Volume:Decreased  Handedness:Right   Mood and Affect  Mood:Euthymic  Affect:Non-Congruent; Flat   Thought Process  Thought Processes:-- (Not able to assess fully due to minimal interaction)  Descriptions of Associations:Intact  Orientation:Full (Time, Place and Person)  Thought Content:-- (Denies SI, HI, AVH, Not able to assess fully due to minimal interaction)  History of Schizophrenia/Schizoaffective disorder:No  Duration of Psychotic Symptoms:No data recorded Hallucinations:Hallucinations: None  Ideas of Reference:None  Suicidal Thoughts:Suicidal Thoughts: No  Homicidal Thoughts:Homicidal Thoughts: No   Sensorium  Memory:Immediate Fair; Recent Poor; Remote Poor  Judgment:Impaired  Insight:Lacking   Executive Functions  Concentration:Fair  Attention Span:Fair  Recall:Poor  Fund of Knowledge:-- (Not able to assess fully due to minimal interaction)  Language:Poor   Psychomotor Activity  Psychomotor Activity:Psychomotor Activity:  Decreased (Not attending groups and not going out of her room)   [place]; Social Support; Financial Resources/Insurance   Sleep  Sleep:Sleep: Good    Physical Exam: Physical Exam Vitals and nursing note reviewed.  Constitutional:      General: She is not in acute distress.    Appearance: Normal appearance. She is not ill-appearing, toxic-appearing or diaphoretic.  HENT:     Head: Normocephalic.  Neurological:     Mental Status: She is alert.   Review of Systems  Psychiatric/Behavioral:  Negative for depression, hallucinations and suicidal ideas. The patient is not nervous/anxious and does not have insomnia.   Blood pressure (!) 86/61, pulse 72, temperature (!) 97.4 F (36.3 C), temperature source Oral, resp. rate 18, height 5\' 4"  (1.626 m), weight 57.6 kg, last menstrual period 11/30/2021, SpO2 100 %, not currently breastfeeding. Body mass index is 21.8 kg/m.   Treatment Plan Summary: Daily contact with patient to assess and evaluate symptoms and progress in treatment Tammy Rosales is a 21 yr old female who presented to [facility] on 2/23 with her grandmother for symptoms of worsening depression, she was admitted to [facility] on 2/24.     Given her flat affect with history of social withdrawal and responding to internal stimuli there is significant concern Severe Post-Partum Depression or even Psychosis.  Due to this Lexapro was continued and Zyprexa was started. Pt is still guarded , minimally interactive, has flat effect.  She shows no improvement with Zyprexa.  Will discontinue Zyprexa due to being ineffective and start her on Geodon 20 mg twice daily due to continued psychosis.   Patient moved to 500 hall due to continued psychosis. We will continue to monitor.    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 -Start Geodon 20 mg twice daily. (Last EKG on 2/24-QTc 438) -Continue agitation  Protocol: Zyprexa/Ativan/Geodon   Poor intake Anemia -Start Ensure twice daily -Send vitamin B12 level, iron studies, vitamin D level -Start multivitamin with iron -Start vitamin D 1000 IU daily.  -Continue PRN's: Tylenol, Maalox, Atarax, Milk of Magnesia, Trazodone   Armando Reichert, MD  PGY2 12/25/2021, 3:13 PM

## 2021-12-25 NOTE — Progress Notes (Signed)
Patient ID: Tammy Rosales, female   DOB: 2001-05-27, 21 y.o.   MRN: [id number]   Pt asleep in her room with even and unlabored respirations. No distress or discomfort noted. Pt remains safe on the unit. Will continue to monitor.

## 2021-12-25 NOTE — Progress Notes (Signed)
°   12/25/21 1712  What Happened  Was fall witnessed? Yes  Who witnessed fall? Boyce Medici, RN  Patients activity before fall other (comment) (standing at med window)  Point of contact buttocks  Was patient injured? No  Follow Up  MD notified Magdalen Spatz  Time MD notified [phone]  Family notified No - patient refusal  Time family notified  (na)  Additional tests Yes-comment  Simple treatment Other (comment) (rest)  Progress note created (see row info) Yes  Adult Fall Risk Assessment  Risk Factor Category (scoring not indicated) Not Applicable  Vitals  Temp (!) 97.5 F (36.4 C) (rn notified)  Temp Source Oral  BP (!) 115/103 (rn notified)  MAP (mmHg) 109  BP Location Left Arm  BP Method Automatic  Patient Position (if appropriate) Sitting  Pulse Rate 78  Pulse Rate Source Monitor  Resp 18  Oxygen Therapy  SpO2 100 %  Pain Assessment  Pain Scale 0-10  Pain Score 0  Neurological  Neuro (WDL) WDL  Musculoskeletal  Musculoskeletal (WDL) WDL

## 2021-12-25 NOTE — Progress Notes (Signed)
°   12/25/21 1200  Psych Admission Type (Psych Patients Only)  Admission Status Voluntary  Psychosocial Assessment  Patient Complaints Anxiety  Eye Contact Brief  Facial Expression Flat  Affect Blunted  Speech Soft  Interaction Minimal  Motor Activity Slow  Appearance/Hygiene Disheveled  Behavior Characteristics Guarded  Mood Depressed  Thought Process  Coherency Blocking  Content UTA  Delusions UTA  Perception UTA  Hallucination Tactile  Judgment Impaired  Confusion UTA  Danger to Self  Current suicidal ideation? Denies  Danger to Others  Danger to Others None reported or observed

## 2021-12-25 NOTE — Group Note (Signed)
LCSW Group Therapy Note ° ° °Group Date: 12/25/2021 °Start Time: 1000 °End Time: 1100 ° ° °Type of Therapy and Topic:  Group Therapy:  ° °LCSW Group Therapy °  °  °Due to high patient acuity and staffing, group was unable to be held on 12/25/2021. The CSW supervisor as well as the on-duty AC was made aware. ° ° ° °Ornella Coderre A Dannah Ryles, LCSW °12/25/2021  12:30 PM   ° °

## 2021-12-25 NOTE — Progress Notes (Signed)
°   12/25/21 1200  °Psych Admission Type (Psych Patients Only)  °Admission Status Voluntary  °Psychosocial Assessment  °Patient Complaints Anxiety  °Eye Contact Brief  °Facial Expression Flat  °Affect Blunted  °Speech Soft  °Interaction Minimal  °Motor Activity Slow  °Appearance/Hygiene Disheveled  °Behavior Characteristics Guarded  °Mood Depressed  °Thought Process  °Coherency Blocking  °Content UTA  °Delusions UTA  °Perception UTA  °Hallucination Tactile  °Judgment Impaired  °Confusion UTA  °Danger to Self  °Current suicidal ideation? Denies  °Danger to Others  °Danger to Others None reported or observed  ° ° °

## 2021-12-25 NOTE — Progress Notes (Deleted)
°   12/25/21 1712  What Happened  Was fall witnessed? Yes  Who witnessed fall? Dossie Arbour, RN  Patients activity before fall other (comment) (standing at med window)  Point of contact buttocks  Was patient injured? No  Follow Up  MD notified Haze Rushing  Time MD notified [phone]  Family notified No - patient refusal  Time family notified  (na)  Additional tests Yes-comment  Simple treatment Other (comment) (rest)  Progress note created (see row info) Yes  Adult Fall Risk Assessment  Risk Factor Category (scoring not indicated) Not Applicable  Vitals  Temp (!) 97.5 F (36.4 C) (rn notified)  Temp Source Oral  BP (!) 115/103 (rn notified)  MAP (mmHg) 109  BP Location Left Arm  BP Method Automatic  Patient Position (if appropriate) Sitting  Pulse Rate 78  Pulse Rate Source Monitor  Resp 18  Oxygen Therapy  SpO2 100 %

## 2021-12-25 NOTE — Progress Notes (Signed)
The focus of this group is to help patients review their daily goal of treatment and discuss progress on daily workbooks. Pt did not attend the evening group. 

## 2021-12-25 NOTE — Progress Notes (Signed)
Writer was called to patients room after post fall vitals were taken at 2000. MHT reports that patient fainted while standing taking vitals and was assisted to the bench in her room. Writer notified NP S.Bobbitt and patient was assessed. She reports that this has been happening prior to being hospitalized and has not been concerned about the reason this fainting has been happening. Writer informed her that a pitcher of gatorade will be given for her to make sure that she drinks. Patient has several items in her room such as ensure, juices and water in her room that has not been touched. Patient has not been compliant with drinking. Writer asked patient why she has been sleeping on the bench in her room and she can not provide a reason for doing this and Clinical research associate encouraged her to lie on her bed in her room so that her body can relax and rest. Writer informed her that since being here she has not been eating nor drinking adequately and this will cause her to feel light headed and dizzy and feeling faint with taking medication on an empty stomach. Patient denies auditory or visual hallucinations, - si/hi and was asked why she had been moved to the 500 hall. She was not able to give a reason. This is the patients 1st hospitalization, she appears very shy and reluctant to opening up to staff. Writer informed her that she will have to start being more verbal with staff other than yes or no responses because her non verbal actions are leading doctors and staff that she make be preoccupied with auditory hallucinations. She was informed and encouraged to start attending groups during the day . Writer asked if she knew that her scrub top was on backwards and she shook her head no. Writer asked if she would feel more comfortable back on the 400 hall and she replied yes. Writer informed charge nurse and was able to find her a roommate her age that may be helpful to help her feel a little less guarded and feel a little more at  ease. 500 hall is for acute psychotic patients and even though patient is difficult to interact with she very well may be suffing from depression and overwhelmed with caring for a baby. She will not elaborate on her relationship at home with her mother. When asked what she needed help with she replied "my mom says I need help" but she was not able to verbalize to writer what she needed help with. Writer did not give any prn medications b/c of lack of adequate intake of food and fluids. Patient currently lying in bed resting. Safety maintained on unit with 15 min checks.

## 2021-12-25 NOTE — Progress Notes (Addendum)
Called by RN that pt had fallen while getting her medications. On assessment, pt was sitting on the floor near the phone on the 500 hall. Fall was witnessed by RN who states that pt became dizzy while at the window, staggered, then lowered self, and dropped on her buttocks to the floor. Pt reports that she had not eaten all day. She confirms landing on her buttocks, denies hitting head. As per staff on the unit, pt refused to go for lunch, but food was brought back and she refused to eat. Capillary blood glucose checked and is 82. V/S checked and as follows: Temp-97.5, HR-78, R-18, BP-115/103, O2 sat-100% on RA. Pt denies pain. Given Gatorade and a sandwich which she is currently eating. Pt assessed by Dr. Berdine Addison and this Probation officer. Staff will continue to monitor her as per post fall protocol.

## 2021-12-25 NOTE — Plan of Care (Signed)
°  Problem: Education: Goal: Mental status will improve Outcome: Not Progressing   Problem: Activity: Goal: Interest or engagement in activities will improve Outcome: Not Progressing   Problem: Coping: Goal: Ability to demonstrate self-control will improve Outcome: Progressing   Problem: Safety: Goal: Periods of time without injury will increase Outcome: Progressing

## 2021-12-25 NOTE — [facility] Group Notes (Signed)
Adult Psychoeducational Group Not °Date:  12/25/2021 °Time:  0900-1045 °Group Topic/Focus: PROGRESSIVE RELAXATION. A group where deep breathing is taught and tensing and relaxation muscle groups is used. Imagery is used as well.  Pts are asked to imagine 3 pillars that hold them up when they are not able to hold themselves up. ° °Participation Level: did not attend ° °Markia Kyer A ° ° °

## 2021-12-26 DIAGNOSIS — E559 Vitamin D deficiency, unspecified: Secondary | ICD-10-CM | POA: Diagnosis present

## 2021-12-26 LAB — IRON AND TIBC
Iron: 20 ug/dL — ABNORMAL LOW (ref 28–170)
Saturation Ratios: 5 % — ABNORMAL LOW (ref 10.4–31.8)
TIBC: 371 ug/dL (ref 250–450)
UIBC: 351 ug/dL

## 2021-12-26 LAB — VITAMIN B12: Vitamin B-12: 546 pg/mL (ref 180–914)

## 2021-12-26 LAB — FERRITIN: Ferritin: 5 ng/mL — ABNORMAL LOW (ref 11–307)

## 2021-12-26 LAB — CBC
HCT: 38.5 % (ref 36.0–46.0)
Hemoglobin: 12.2 g/dL (ref 12.0–15.0)
MCH: 25.8 pg — ABNORMAL LOW (ref 26.0–34.0)
MCHC: 31.7 g/dL (ref 30.0–36.0)
MCV: 81.6 fL (ref 80.0–100.0)
Platelets: 248 10*3/uL (ref 150–400)
RBC: 4.72 MIL/uL (ref 3.87–5.11)
RDW: 14.9 % (ref 11.5–15.5)
WBC: 5.1 10*3/uL (ref 4.0–10.5)
nRBC: 0 % (ref 0.0–0.2)

## 2021-12-26 LAB — BASIC METABOLIC PANEL
Anion gap: 6 (ref 5–15)
BUN: 10 mg/dL (ref 6–20)
CO2: 25 mmol/L (ref 22–32)
Calcium: 8.9 mg/dL (ref 8.9–10.3)
Chloride: 107 mmol/L (ref 98–111)
Creatinine, Ser: 0.7 mg/dL (ref 0.44–1.00)
GFR, Estimated: >60 mL/min (ref >60)
Glucose, Bld: 68 mg/dL — ABNORMAL LOW (ref 70–99)
Potassium: 2.7 mmol/L — CL (ref 3.5–5.1)
Sodium: 138 mmol/L (ref 135–145)

## 2021-12-26 LAB — VITAMIN D 25 HYDROXY (VIT D DEFICIENCY, FRACTURES): Vit D, 25-Hydroxy: 9.88 ng/mL — ABNORMAL LOW (ref 30–100)

## 2021-12-26 LAB — MAGNESIUM: Magnesium: 1.8 mg/dL (ref 1.7–2.4)

## 2021-12-26 NOTE — Progress Notes (Signed)
°   12/26/21 2010  Psych Admission Type (Psych Patients Only)  Admission Status Voluntary  Psychosocial Assessment  Patient Complaints None  Eye Contact Avoids  Facial Expression Flat  Affect Blunted  Speech Slow;Soft  Interaction Guarded;Minimal;No initiation  Motor Activity Slow  Appearance/Hygiene Unremarkable  Behavior Characteristics Cooperative  Mood Depressed  Thought Process  Coherency WDL  Content WDL  Delusions None reported or observed  Perception WDL  Hallucination None reported or observed  Judgment Impaired  Confusion WDL  Danger to Self  Current suicidal ideation? Denies  Danger to Others  Danger to Others None reported or observed   Tammy Rosales was isolative to her room this evening.  Minimal with conversation would only say yes/no quietly to questions asked.  She voiced no SI/HI or AVH.  She declined prn medication for sleep.  She did not accept snack but was noticed drinking water.  She did not go to evening AA group even after much encouragement.  She appears to be in no physical distress.  Q 15 minute checks maintained for safety.

## 2021-12-26 NOTE — Progress Notes (Signed)
[facility] MD Progress Note  12/26/2021 3:47 PM Tammy Rosales  MRN:  [id number] Subjective:   Tammy Rosales is a 21 yr old female who presented to [facility] on 2/23 with her grandmother for symptoms of worsening depression, she was admitted to [facility] on 2/24.  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   Case was discussed in the multidisciplinary team. MAR was reviewed and patient was compliant with medications.   Psychiatric Team made the following recommendations yesterday: -Stop Zyprexa -Start Geodon 20 mg BID -Continue Agitation Protocol: Zyprexa/Ativan/Geodon -Start Ensure BID -Start Multivitamin with iron -Start Vitamin D 1000 IU daily    On interview patient reports she slept well last night.  She reports that her appetite is doing good and ate a full breakfast of eggs, bacon, and a biscuit.  She reports no SI, HI, or AVH.  She reports no P[place]ia, Ideas of Reference, or other First Rank symptoms.  She reports no issues with her medication.  She reports no further symptoms of dizziness or lightheadedness.  When asked how she is feeling today she replies great (and a flat affect staring at the wall).  When asked to further elaborate what is great she states being alive, the future and not knowing what the next.  I then asked her since her family thought she needed to be here to get help what that she thinks she needs help with.  She states she needs help speaking up for herself because she is very quiet but that is all.  She then states she actually feels better with her medications.  She reports no other concerns at present.   Principal Problem: Post partum depression Diagnosis: Principal Problem:   Post partum depression  Total Time spent with patient:  I personally spent 25 minutes on the unit in direct patient care. The direct patient care time included face-to-face time with the patient, reviewing the patient's chart, communicating with other professionals, and coordinating  care. Greater than 50% of this time was spent in counseling or coordinating care with the patient regarding goals of hospitalization, psycho-education, and discharge planning needs.   Past Psychiatric History: Post-Partum Depression  Past Medical History:  Past Medical History:  Diagnosis Date   History of preterm labor 06/06/2021   SVD 03/2021 at 33 weeks   Medical history non-contributory     Past Surgical History:  Procedure Laterality Date   DILATION AND CURETTAGE OF UTERUS     NO PAST SURGERIES     Family History:  Family History  Problem Relation Age of Onset   Hypertension Maternal Aunt    Family Psychiatric  History:  Reports no known Diagnosis', Substance Abuse, or Suicides. Social History:  Social History   Substance and Sexual Activity  Alcohol Use Not Currently     Social History   Substance and Sexual Activity  Drug Use Not Currently    Social History   Socioeconomic History   Marital status: Single    Spouse name: Not on file   Number of children: Not on file   Years of education: Not on file   Highest education level: Not on file  Occupational History   Not on file  Tobacco Use   Smoking status: Never   Smokeless tobacco: Never  Vaping Use   Vaping Use: Never used  Substance and Sexual Activity   Alcohol use: Not Currently   Drug use: Not Currently   Sexual activity: Not Currently  Other Topics Concern   Not on file  Social History Narrative   Not on file   Social Determinants of Health   Financial Resource Strain: Not on file  Food Insecurity: Food Insecurity Present   Worried About Charity fundraiser in the Last Year: Sometimes true   Arboriculturist in the Last Year: Sometimes true  Transportation Needs: Public librarian (Medical): Yes   Lack of Transportation (Non-Medical): Yes  Physical Activity: Not on file  Stress: Not on file  Social Connections: Not on file   Additional Social History:                          Sleep: Good  Appetite:  Good  Current Medications: Current Facility-Administered Medications  Medication Dose Route Frequency Provider Last Rate Last Admin   acetaminophen (TYLENOL) tablet 650 mg  650 mg Oral Q6H PRN Lucky Rathke, FNP       alum & mag hydroxide-simeth (MAALOX/MYLANTA) 200-200-20 MG/5ML suspension 30 mL  30 mL Oral Q4H PRN Lucky Rathke, FNP       escitalopram (LEXAPRO) tablet 10 mg  10 mg Oral Daily Lucky Rathke, FNP   10 mg at 12/26/21 [id number]   feeding supplement (ENSURE ENLIVE / ENSURE PLUS) liquid 237 mL  237 mL Oral BID BM Armando Reichert, MD   237 mL at 12/26/21 1153   hydrOXYzine (ATARAX) tablet 25 mg  25 mg Oral TID PRN Lucky Rathke, FNP       OLANZapine zydis (ZYPREXA) disintegrating tablet 5 mg  5 mg Oral Q8H PRN Tayte Childers, Redgie Grayer, MD       And   LORazepam (ATIVAN) tablet 1 mg  1 mg Oral PRN Briant Cedar, MD       And   ziprasidone (GEODON) injection 20 mg  20 mg Intramuscular PRN Briant Cedar, MD       magnesium hydroxide (MILK OF MAGNESIA) suspension 30 mL  30 mL Oral Daily PRN Lucky Rathke, FNP       multivitamins with iron tablet 1 tablet  1 tablet Oral Daily Armando Reichert, MD   1 tablet at 12/26/21 [id number]   traZODone (DESYREL) tablet 50 mg  50 mg Oral QHS PRN Lucky Rathke, FNP       Vitamin D3 (Vitamin D) tablet 1,000 Units  1,000 Units Oral Daily Armando Reichert, MD   1,000 Units at 12/26/21 [id number]   ziprasidone (GEODON) capsule 20 mg  20 mg Oral BID WC Armando Reichert, MD   20 mg at 12/26/21 [id number]    Lab Results:  Results for orders placed or performed during the hospital encounter of 12/22/21 (from the past 48 hour(s))  Glucose, capillary     Status: None   Collection Time: 12/25/21  5:21 PM  Result Value Ref Range   Glucose-Capillary 82 70 - 99 mg/dL    Comment: Glucose reference range applies only to samples taken after fasting for at least 8 hours.  Vitamin B12     Status: None   Collection Time:  12/26/21  6:23 AM  Result Value Ref Range   Vitamin B-12 546 180 - 914 pg/mL    Comment: (NOTE) This assay is not validated for testing neonatal or myeloproliferative syndrome specimens for Vitamin B12 levels. Performed at [facility], [place] [address], [place], [place] [address]   VITAMIN D 25 Hydroxy (Vit-D Deficiency, Fractures)     Status: Abnormal  Collection Time: 12/26/21  6:23 AM  Result Value Ref Range   Vit D, 25-Hydroxy 9.88 (L) 30 - 100 ng/mL    Comment: (NOTE) Vitamin D deficiency has been defined by the [place] practice guideline as a level of serum 25-OH  vitamin D less than 20 ng/mL (1,2). The Endocrine Society went on to  further define vitamin D insufficiency as a level between 21 and 29  ng/mL (2).  1. IOM (Institute of Medicine). 2010. Dietary reference intakes for  calcium and D. [place]: The [employer]. 2. Holick MF, Binkley Pinckney, Bischoff-Ferrari HA, et al. Evaluation,  treatment, and prevention of vitamin D deficiency: an Endocrine  Society clinical practice guideline, JCEM. 2011 Jul; 96(7): 1911-30.  Performed at [facility], [place] [address], [place], [place] [address]   Iron and TIBC     Status: Abnormal   Collection Time: 12/26/21  6:23 AM  Result Value Ref Range   Iron 20 (L) 28 - 170 ug/dL   TIBC 371 250 - 450 ug/dL   Saturation Ratios 5 (L) 10.4 - 31.8 %   UIBC 351 ug/dL    Comment: Performed at [facility], [place] [address], [place], [place] [address]  Ferritin     Status: Abnormal   Collection Time: 12/26/21  6:23 AM  Result Value Ref Range   Ferritin 5 (L) 11 - 307 ng/mL    Comment: Performed at [facility], [place] [address] [place], [place] [address]    Blood Alcohol level:  Lab Results  Component Value Date   ETH <10 99991111    Metabolic Disorder Labs: Lab Results  Component Value Date   HGBA1C 5.7 (H)  12/22/2021   MPG 117 12/22/2021   Lab Results  Component Value Date   PROLACTIN 15.7 12/22/2021   Lab Results  Component Value Date   CHOL 177 12/22/2021   TRIG 43 12/22/2021   HDL 58 12/22/2021   CHOLHDL 3.1 12/22/2021   VLDL 9 12/22/2021   LDLCALC 110 (H) 12/22/2021    Physical Findings: AIMS: Facial and Oral Movements Muscles of Facial Expression: None, normal Lips and Perioral Area: None, normal Jaw: None, normal Tongue: None, normal,Extremity Movements Upper (arms, wrists, hands, fingers): None, normal Lower (legs, knees, ankles, toes): None, normal, Trunk Movements Neck, shoulders, hips: None, normal, Overall Severity Severity of abnormal movements (highest score from questions above): None, normal Incapacitation due to abnormal movements: None, normal Patient's awareness of abnormal movements (rate only patient's report): No Awareness, Dental Status Current problems with teeth and/or dentures?: No Does patient usually wear dentures?: No  CIWA:    COWS:     Musculoskeletal: Strength & Muscle Tone: within normal limits Gait & Station: normal Patient leans: N/A  Psychiatric Specialty Exam:  Presentation  General Appearance: Appropriate for Environment; Casual; Fairly Groomed  Eye Contact:Poor  Speech:Clear and Coherent (only answered in yes/no for most of the questions)  Speech Volume:Decreased  Handedness:Right   Mood and Affect  Mood:-- ("great")  Affect:Flat; Non-Congruent   Thought Process  Thought Processes:-- (Not able to assess fully due to minimal interaction)  Descriptions of Associations:Intact  Orientation:Full (Time, Place and Person)  Thought Content:-- (Not able to assess fully due to minimal interaction)  History of Schizophrenia/Schizoaffective disorder:No  Duration of Psychotic Symptoms:No data recorded Hallucinations:Hallucinations: None  Ideas of Reference:None  Suicidal Thoughts:Suicidal Thoughts: No  Homicidal  Thoughts:Homicidal Thoughts: No   Sensorium  Memory:Immediate  Fair; Recent Poor  Judgment:Poor  Insight:Poor   Executive Functions  Concentration:Fair  Attention Span:Fair  Recall:Poor  Fund of Knowledge:Poor  Language:Poor   Psychomotor Activity  Psychomotor Activity:Psychomotor Activity: Decreased   Assets  Assets:Physical Health; Social Support; Financial Resources/Insurance   Sleep  Sleep:Sleep: Good    Physical Exam: Physical Exam Vitals and nursing note reviewed.  Constitutional:      General: She is not in acute distress.    Appearance: Normal appearance. She is normal weight. She is not ill-appearing or toxic-appearing.  HENT:     Head: Normocephalic and atraumatic.  Pulmonary:     Effort: Pulmonary effort is normal.  Musculoskeletal:        General: Normal range of motion.  Neurological:     General: No focal deficit present.     Mental Status: She is alert.   Review of Systems  Respiratory:  Negative for cough and shortness of breath.   Cardiovascular:  Negative for chest pain.  Gastrointestinal:  Negative for abdominal pain, constipation, diarrhea, nausea and vomiting.  Neurological:  Negative for dizziness, weakness and headaches.  Psychiatric/Behavioral:  Negative for depression, hallucinations and suicidal ideas. The patient is not nervous/anxious.   Blood pressure (!) 100/59, pulse 92, temperature 97.9 F (36.6 C), temperature source Oral, resp. rate 18, height 5\' 4"  (1.626 m), weight 57.6 kg, last menstrual period 11/30/2021, SpO2 100 %, not currently breastfeeding. Body mass index is 21.8 kg/m.   Treatment Plan Summary: Daily contact with patient to assess and evaluate symptoms and progress in treatment and Medication management  Tammy Rosales is a 21 yr old female who presented to [facility] on 2/23 with her grandmother for symptoms of worsening depression, she was admitted to [facility] on 2/24.  PPHx is significant for Post-Partum  Depression.   Timoteo Expose continues to be flat and minimally responsive during interview, however, she has made some improvement.  Since she did pass out yesterday due to poor oral intake we will not make any medication changes a this time and continue to encourage eating and drinking.  We will continue to monitor.     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 Labs (2/26): Ferritin: 5,  Iron and TIBC: WNL except  Iron:20,  Sat Ratio: 5,  Vit B12:546,  Vit D: 9.88  Briant Cedar, MD 12/26/2021, 3:47 PM

## 2021-12-26 NOTE — B[facility] Group Notes (Signed)
Orientation Group and Psychoeducational teaching. Patients were asked to share how they are feeling mentally today, and unit and ward rules, expectations were discussed. Psychoeducational teaching was then discussed with habits of mindfulness and positive reframing techniques to improve mental health. The patients were given a poem of the Dalia Lama and asked one technique to use to help calm anxiety and negative thoughts. The patient attended group.  °

## 2021-12-26 NOTE — Progress Notes (Signed)
DAR NOTE: Patient presents with a flat affect and depressed mood.  Denies suicidal thoughts, pain, auditory and visual hallucinations.  Described energy level as normal with good concentration.  Rates depression at 5, hopelessness at 0, and anxiety at 0.  Maintained on routine safety checks.  Medications given as prescribed.  Support and encouragement offered as needed.  Attended group and participated.  States goal for today is "speaking."  Patient is withdrawn and isolative to her room.  Patient is safe on and off the unit.  Offered no complaint.

## 2021-12-26 NOTE — Group Note (Signed)
LCSW Group Therapy Note  Group Date: 12/26/2021 Start Time: 1300 End Time: 1400   Type of Therapy and Topic:  Group Therapy: Positive Affirmations  Participation Level:  Minimal   Description of Group:   This group addressed positive affirmation towards self and others.  Patients went around the room and identified two positive things about themselves and two positive things about a peer in the room.  Patients reflected on how it felt to share something positive with others, to identify positive things about themselves, and to hear positive things from others/ Patients were encouraged to have a daily reflection of positive characteristics or circumstances.   Therapeutic Goals: Patients will verbalize two of their positive qualities Patients will demonstrate empathy for others by stating two positive qualities about a peer in the group Patients will verbalize their feelings when voicing positive self affirmations and when voicing positive affirmations of others Patients will discuss the potential positive impact on their wellness/recovery of focusing on positive traits of self and others.  Summary of Patient Progress:  Pt was actively engaged in the discussion and  was able to identify positive affirmations about themself as well as other group members. Patient demonstrated insight into the subject matter, was respectful of peers, and participated throughout the entire session.  Pt also accepted the worksheets that were provided and followed along with those worksheets.    Therapeutic Modalities:   Cognitive Behavioral Therapy Motivational Interviewing    Aram Beecham, [place] 12/26/2021  1:55 PM

## 2021-12-26 NOTE — Progress Notes (Signed)
Psychoeducational Group Note  Date:  12/26/2021 Time:  2109  Group Topic/Focus:  Wrap-Up Group:   The focus of this group is to help patients review their daily goal of treatment and discuss progress on daily workbooks.  Participation Level: Did Not Attend  Participation Quality:  Not Applicable  Affect:  Not Applicable  Cognitive:  Not Applicable  Insight:  Not Applicable  Engagement in Group: Not Applicable  Additional Comments:  The patient did not attend the A.A.meeting this evening.   Hazle Coca S 12/26/2021, 9:09 PM

## 2021-12-26 NOTE — Group Note (Signed)
Recreation Therapy Group Note   Group Topic:Stress Management  Group Date: 12/26/2021 Start Time: 0930 End Time: 0950 Facilitators: Caroll Rancher, [place] Location: [address]   Goal Area(s) Addresses:  Patient will identify positive stress management techniques. Patient will identify benefits of using stress management post d/c.  Group Description:  Stress Release.  LRT played a meditation that focused on releasing stress through breathing and muscle tension and release.  Patients were to follow along as meditation played and go through the steps as the speaker was leading them through.  LRT and patients also discussed places like apps and Youtube  to access other stress management techniques.   Affect/Mood: Appropriate   Participation Level: Active   Participation Quality: Independent   Behavior: Appropriate   Speech/Thought Process: Focused   Insight: Good   Judgement: Good   Modes of Intervention: Meditation   Patient Response to Interventions:  Attentive   Education Outcome:  Acknowledges education and In group clarification offered    Clinical Observations/Individualized Feedback: Pt attended and participated in group.    Plan: Continue to engage patient in RT group sessions 2-3x/week.   Caroll Rancher, LRT,CTRS 12/26/2021 1:12 PM

## 2021-12-27 DIAGNOSIS — F323 Major depressive disorder, single episode, severe with psychotic features: Secondary | ICD-10-CM | POA: Diagnosis present

## 2021-12-27 LAB — URINALYSIS, COMPLETE (UACMP) WITH MICROSCOPIC
Bilirubin Urine: NEGATIVE
Glucose, UA: NEGATIVE mg/dL
Hgb urine dipstick: NEGATIVE
Ketones, ur: 5 mg/dL — AB
Leukocytes,Ua: NEGATIVE
Nitrite: NEGATIVE
Protein, ur: 30 mg/dL — AB
Specific Gravity, Urine: 1.032 — ABNORMAL HIGH (ref 1.005–1.030)
pH: 6 (ref 5.0–8.0)

## 2021-12-27 LAB — GLUCOSE, CAPILLARY: Glucose-Capillary: 79 mg/dL (ref 70–99)

## 2021-12-27 MED ORDER — POTASSIUM CHLORIDE CRYS ER 20 MEQ PO TBCR
40.0000 meq | EXTENDED_RELEASE_TABLET | Freq: Two times a day (BID) | ORAL | Status: DC
  Administered 2021-12-27 – 2021-12-28 (×2): 40 meq via ORAL
  Filled 2021-12-27 (×7): qty 2

## 2021-12-27 NOTE — Progress Notes (Signed)
°   12/27/21 1008  Psych Admission Type (Psych Patients Only)  Admission Status Voluntary  Psychosocial Assessment  Patient Complaints None  Eye Contact Avoids  Facial Expression Flat  Affect Blunted  Speech Soft  Interaction No initiation  Motor Activity Other (Comment) (WNL)  Appearance/Hygiene Unremarkable  Behavior Characteristics Cooperative;Calm  Mood Preoccupied  Thought Process  Coherency WDL  Content WDL  Delusions None reported or observed  Perception WDL  Hallucination None reported or observed  Judgment Impaired  Confusion None  Danger to Self  Current suicidal ideation? Denies  Danger to Others  Danger to Others None reported or observed

## 2021-12-27 NOTE — B[facility] Group Notes (Signed)
[facility] Group Notes:  (Nursing/MHT/Case Management/Adjunct)  Date:  12/27/2021  Time:  9:51 PM  Type of Therapy:    Wrap up  Participation Level:  Active  Participation Quality:  Appropriate and Attentive  Affect:  Appropriate  Cognitive:  Appropriate  Insight:  Appropriate  Engagement in Group:  Developing/Improving  Modes of Intervention:  Discussion  Summary of Progress/Problems: PT attended group in a good mood. P\T said that she wants to work on coming out of her room more and socializing.  Lorita Officer 12/27/2021, 9:51 PM

## 2021-12-27 NOTE — Group Note (Signed)
Recreation Therapy Group Note   Group Topic:Animal Assisted Therapy   Group Date: 12/27/2021 Start Time: 1420 End Time: 1507 Facilitators: Caroll Rancher, LRT, CTRS Location: [address]   AAA/T Program Assumption of Risk Form signed by Patient/ or Parent Legal Guardian YES  Patient is free of allergies or severe asthma  YES  Patient reports no fear of animals YES  Patient reports no history of cruelty to animals YES  Patient understands their participation is voluntary YES  Patient washes hands before animal contact YES  Patient washes hands after animal contact YES   Group Description: Patients provided opportunity to interact with trained and credentialed [employer] Therapy dog and the community volunteer/dog handler. Patients practiced appropriate animal interaction and were educated on dog safety outside of the hospital in common community settings. Patients were allowed to use dog toys and other items to practice commands, engage the dog in play, and/or complete routine aspects of animal care.   Education: Charity fundraiser, Health visitor, Communication & Social Skills    Affect/Mood: Appropriate   Participation Level: Minimal    Clinical Observations/Individualized Feedback: Pt sat and observed peers as they interacted with therapy dog.     Plan: Continue to engage patient in RT group sessions 2-3x/week.   Caroll Rancher, Antonietta Jewel 12/27/2021 3:45 PM

## 2021-12-27 NOTE — Plan of Care (Signed)
  Problem: Activity: Goal: Interest or engagement in activities will improve Outcome: Progressing   Problem: Coping: Goal: Ability to verbalize frustrations and anger appropriately will improve Outcome: Progressing   Problem: Coping: Goal: Ability to demonstrate self-control will improve Outcome: Progressing   Problem: Safety: Goal: Periods of time without injury will increase Outcome: Progressing   

## 2021-12-27 NOTE — Progress Notes (Addendum)
[facility] MD Progress Note  12/27/2021 6:44 PM Tammy Rosales  MRN:  [id number]  Subjective:   Tammy Rosales is a 21 yr old female who presented to [facility] on 2/23 with her grandmother for symptoms of worsening depression, she was admitted to [facility] on 2/24.  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  Case was discussed in the multidisciplinary team. MAR was reviewed and patient was compliant with medications.  She did not receive any PRN medications.  Psychiatric Team made the following recommendations yesterday: -Continue Lexapro 10 mg daily -Continue Geodon 20 mg BID -Continue Agitation Protocol: Zyprexa/Ativan/Geodon -Continue Ensure BID -Continue Multivitamin with iron -Continue Vitamin D 1000 IU daily  On interview patient reports her appetite is doing good.  She reports her sleep is doing good.  She reports no SI, HI, or AVH.  She reports no Paranoia, Ideas of Reference, or other First Rank symptoms.  She reports no issues with her medications.  She reports that she has had no other issues with dizziness/weakness.  When asked how groups went yesterday she states they went well.  She states that she learned about breathing techniques.  Encouraged her to continue practicing them and refining them so they become second nature.   When asked if anyone had come to visit she reported no.  When asked why, she stated she had not asked them to visit.  Encouraged her to reach out to her family.  Confirmed with her that she is no longer breast feeding her child and she has no plans to begin again.  Discussed that we would recommend increasing her Geodon further as she has had a good response to it so far but she declined any increase.  She reports no other concerns at present.   Principal Problem: MDD (major depressive disorder), single episode, severe with psychotic features (HCC) Diagnosis: Principal Problem:   MDD (major depressive disorder), single episode, severe with psychotic features  (HCC) Active Problems:   Post partum depression   Vitamin D deficiency  Total Time spent with patient:  I personally spent 25 minutes on the unit in direct patient care. The direct patient care time included face-to-face time with the patient, reviewing the patient's chart, communicating with other professionals, and coordinating care. Greater than 50% of this time was spent in counseling or coordinating care with the patient regarding goals of hospitalization, psycho-education, and discharge planning needs.   Past Psychiatric History: Post-Partum Depression  Past Medical History:  Past Medical History:  Diagnosis Date   History of preterm labor 06/06/2021   SVD 03/2021 at 33 weeks   Medical history non-contributory     Past Surgical History:  Procedure Laterality Date   DILATION AND CURETTAGE OF UTERUS     NO PAST SURGERIES     Family History:  Family History  Problem Relation Age of Onset   Hypertension Maternal Aunt    Family Psychiatric  History:  Reports no known Diagnosis', Substance Abuse, or Suicides. Social History:  Social History   Substance and Sexual Activity  Alcohol Use Not Currently     Social History   Substance and Sexual Activity  Drug Use Not Currently    Social History   Socioeconomic History   Marital status: Single    Spouse name: Not on file   Number of children: Not on file   Years of education: Not on file   Highest education level: Not on file  Occupational History   Not on file  Tobacco Use  Smoking status: Never   Smokeless tobacco: Never  Vaping Use   Vaping Use: Never used  Substance and Sexual Activity   Alcohol use: Not Currently   Drug use: Not Currently   Sexual activity: Not Currently  Other Topics Concern   Not on file  Social History Narrative   Not on file   Social Determinants of Health   Financial Resource Strain: Not on file  Food Insecurity: Food Insecurity Present   Worried About Running Out of Food in the  Last Year: Sometimes true   Ran Out of Food in the Last Year: Sometimes true  Transportation Needs: Unmet Transportation Needs   Lack of Transportation (Medical): Yes   Lack of Transportation (Non-Medical): Yes  Physical Activity: Not on file  Stress: Not on file  Social Connections: Not on file   Sleep: Good  Appetite:  Good  Current Medications: Current Facility-Administered Medications  Medication Dose Route Frequency Provider Last Rate Last Admin   acetaminophen (TYLENOL) tablet 650 mg  650 mg Oral Q6H PRN Lenard LanceAllen, Tina L, FNP       alum & mag hydroxide-simeth (MAALOX/MYLANTA) 200-200-20 MG/5ML suspension 30 mL  30 mL Oral Q4H PRN Lenard LanceAllen, Tina L, FNP       escitalopram (LEXAPRO) tablet 10 mg  10 mg Oral Daily Lenard LanceAllen, Tina L, FNP   10 mg at 12/27/21 0749   feeding supplement (ENSURE ENLIVE / ENSURE PLUS) liquid 237 mL  237 mL Oral BID BM Karsten Rooda, Vandana, MD   237 mL at 12/27/21 1617   hydrOXYzine (ATARAX) tablet 25 mg  25 mg Oral TID PRN Lenard LanceAllen, Tina L, FNP       OLANZapine zydis (ZYPREXA) disintegrating tablet 5 mg  5 mg Oral Q8H PRN Pashayan, Mardelle MatteAlexander S, MD       And   LORazepam (ATIVAN) tablet 1 mg  1 mg Oral PRN Lauro FranklinPashayan, Alexander S, MD       And   ziprasidone (GEODON) injection 20 mg  20 mg Intramuscular PRN Lauro FranklinPashayan, Alexander S, MD       magnesium hydroxide (MILK OF MAGNESIA) suspension 30 mL  30 mL Oral Daily PRN Lenard LanceAllen, Tina L, FNP       multivitamins with iron tablet 1 tablet  1 tablet Oral Daily Karsten Rooda, Vandana, MD   1 tablet at 12/27/21 0749   traZODone (DESYREL) tablet 50 mg  50 mg Oral QHS PRN Lenard LanceAllen, Tina L, FNP       Vitamin D3 (Vitamin D) tablet 1,000 Units  1,000 Units Oral Daily Karsten Rooda, Vandana, MD   1,000 Units at 12/27/21 0749   ziprasidone (GEODON) capsule 20 mg  20 mg Oral BID WC Karsten Rooda, Vandana, MD   20 mg at 12/27/21 1617    Lab Results:  Results for orders placed or performed during the hospital encounter of 12/22/21 (from the past 48 hour(s))  Vitamin B12      Status: None   Collection Time: 12/26/21  6:23 AM  Result Value Ref Range   Vitamin B-12 546 180 - 914 pg/mL    Comment: (NOTE) This assay is not validated for testing neonatal or myeloproliferative syndrome specimens for Vitamin B12 levels. Performed at [facility], [address] [place], [place] [address]   VITAMIN D 25 Hydroxy (Vit-D Deficiency, Fractures)     Status: Abnormal   Collection Time: 12/26/21  6:23 AM  Result Value Ref Range   Vit D, 25-Hydroxy 9.88 (L) 30 - 100 ng/mL  Comment: (NOTE) Vitamin D deficiency has been defined by the Institute of Medicine  and an Endocrine Society practice guideline as a level of serum 25-OH  vitamin D less than 20 ng/mL (1,2). The Endocrine Society went on to  further define vitamin D insufficiency as a level between 21 and 29  ng/mL (2).  1. IOM (Institute of Medicine). 2010. Dietary reference intakes for  calcium and D. [place]: [employer]. 2. Holick MF, Binkley , Bischoff-Ferrari HA, et al. Evaluation,  treatment, and prevention of vitamin D deficiency: an Endocrine  Society clinical practice guideline, JCEM. 2011 Jul; 96(7): 1911-30.  Performed at [facility], [address] [place], [place] [address]   Iron and TIBC     Status: Abnormal   Collection Time: 12/26/21  6:23 AM  Result Value Ref Range   Iron 20 (L) 28 - 170 ug/dL   TIBC 191371 478250 - 295450 ug/dL   Saturation Ratios 5 (L) 10.4 - 31.8 %   UIBC 351 ug/dL    Comment: Performed at [facility], [address] [place], [place] [address]  Ferritin     Status: Abnormal   Collection Time: 12/26/21  6:23 AM  Result Value Ref Range   Ferritin 5 (L) 11 - 307 ng/mL    Comment: Performed at [facility], [address] [place], [place] [address]  Basic metabolic panel     Status: Abnormal   Collection Time: 12/26/21  6:26 PM  Result Value Ref Range   Sodium 138 135 - 145 mmol/L    Potassium 2.7 (LL) 3.5 - 5.1 mmol/L    Comment: CRITICAL RESULT CALLED TO, READ BACK BY AND VERIFIED WITH: MAYA RAMOS RN  12/26/21 @ 2000 VS    Chloride 107 98 - 111 mmol/L   CO2 25 22 - 32 mmol/L   Glucose, Bld 68 (L) 70 - 99 mg/dL    Comment: Glucose reference range applies only to samples taken after fasting for at least 8 hours.   BUN 10 6 - 20 mg/dL   Creatinine, Ser 4.690.70 0.44 - 1.00 mg/dL   Calcium 8.9 8.9 - 62.910.3 mg/dL   GFR, Estimated >52>60 >84>60 mL/min    Comment: (NOTE) Calculated using the CKD-EPI Creatinine Equation (2021)    Anion gap 6 5 - 15    Comment: Performed at [facility], [address] [place], [place] [address]  CBC     Status: Abnormal   Collection Time: 12/26/21  6:26 PM  Result Value Ref Range   WBC 5.1 4.0 - 10.5 K/uL   RBC 4.72 3.87 - 5.11 MIL/uL   Hemoglobin 12.2 12.0 - 15.0 g/dL   HCT 01.038.5 27.236.0 - 53.646.0 %   MCV 81.6 80.0 - 100.0 fL   MCH 25.8 (L) 26.0 - 34.0 pg   MCHC 31.7 30.0 - 36.0 g/dL   RDW 64.414.9 03.411.5 - 74.215.5 %   Platelets 248 150 - 400 K/uL   nRBC 0.0 0.0 - 0.2 %    Comment: Performed at [facility], [address] [place], [place] [address]  Magnesium     Status: None   Collection Time: 12/26/21  6:26 PM  Result Value Ref Range   Magnesium 1.8 1.7 - 2.4 mg/dL    Comment: Performed at [facility], [address] [place], [place] [address]  Glucose, capillary     Status: None   Collection Time: 12/27/21 11:52  AM  Result Value Ref Range   Glucose-Capillary 79 70 - 99 mg/dL    Comment: Glucose reference range applies only to samples taken after fasting for at least 8 hours.   Comment 1 Notify RN     Blood Alcohol level:  Lab Results  Component Value Date   ETH <10 12/22/2021    Metabolic Disorder Labs: Lab Results  Component Value Date   HGBA1C 5.7 (H) 12/22/2021   MPG 117 12/22/2021   Lab Results  Component Value Date   PROLACTIN 15.7 12/22/2021   Lab Results  Component  Value Date   CHOL 177 12/22/2021   TRIG 43 12/22/2021   HDL 58 12/22/2021   CHOLHDL 3.1 12/22/2021   VLDL 9 12/22/2021   LDLCALC 110 (H) 12/22/2021    Physical Findings: AIMS: Facial and Oral Movements Muscles of Facial Expression: None, normal Lips and Perioral Area: None, normal Jaw: None, normal Tongue: None, normal,Extremity Movements Upper (arms, wrists, hands, fingers): None, normal Lower (legs, knees, ankles, toes): None, normal, Trunk Movements Neck, shoulders, hips: None, normal, Overall Severity Severity of abnormal movements (highest score from questions above): None, normal Incapacitation due to abnormal movements: None, normal Patient's awareness of abnormal movements (rate only patient's report): No Awareness, Dental Status Current problems with teeth and/or dentures?: No Does patient usually wear dentures?: No    Musculoskeletal: Strength & Muscle Tone: within normal limits Gait & Station: normal Patient leans: N/A  Psychiatric Specialty Exam:  Presentation  General Appearance: Appropriate for Environment; Casual; Fairly Groomed  Eye Contact:Fair  Speech:Clear and Coherent (mostly yes/no answers but starting to elaborate on some)  Speech Volume:Decreased  Handedness:Right   Mood and Affect  Mood:- described as "great" - appears aloof  Affect:Non-Congruent; Flat (with some periods where had emotion)   Thought Process  Thought Processes:- superficially goal directed  Descriptions of Associations:Intact  Orientation:Full (Time, Place and Person)  Thought Content:Denies SI, HI, AVH, paranoia, ideas of reference, or first rank symptoms; is not grossly responding to internal/external stimuli on exam; less guarded  History of Schizophrenia/Schizoaffective disorder:No  Hallucinations:Hallucinations: None  Ideas of Reference:None  Suicidal Thoughts:Suicidal Thoughts: No  Homicidal Thoughts:Homicidal Thoughts: No   Sensorium   Memory:Immediate Fair; Recent Poor  Judgment:Fair - taking meds  Insight:Poor   Executive Functions  Concentration:Fair  Attention Span:Fair  Recall:Poor  Fund of Knowledge:Fair  Language:Fair   Psychomotor Activity  Psychomotor Activity:Psychomotor Activity: Decreased   Assets  Assets:Physical Health; Financial Resources/Insurance; Social Support   Sleep  Sleep:Sleep: Good Number of Hours of Sleep: 7.75   Physical Exam Vitals and nursing note reviewed.  Constitutional:      General: She is not in acute distress.    Appearance: Normal appearance. She is normal weight. She is not ill-appearing or toxic-appearing.  HENT:     Head: Normocephalic and atraumatic.  Pulmonary:     Effort: Pulmonary effort is normal.  Musculoskeletal:        General: Normal range of motion.  Neurological:     General: No focal deficit present.     Mental Status: She is alert.   Review of Systems  Respiratory:  Negative for cough and shortness of breath.   Cardiovascular:  Negative for chest pain.  Gastrointestinal:  Negative for abdominal pain, constipation, diarrhea, nausea and vomiting.  Neurological:  Negative for dizziness, weakness and headaches.  Psychiatric/Behavioral:  Negative for depression, hallucinations and suicidal ideas. The patient is not nervous/anxious.   Blood pressure 105/68, pulse 63, temperature 97.8 F (  36.6 C), temperature source Oral, resp. rate 18, height 5\' 4"  (1.626 m), weight 57.6 kg, last menstrual period 11/30/2021, SpO2 100 %, not currently breastfeeding. Body mass index is 21.8 kg/m.   Treatment Plan Summary: Daily contact with patient to assess and evaluate symptoms and progress in treatment and Medication management  Tammy Rosales is a 21 yr old female who presented to [facility] on 2/23 with her grandmother for symptoms of worsening depression with psychotic features on admission. She was admitted to [facility] on 2/24.   3/24 is showing  improvement in that she is not flat the entire conversation with affect coming through occasionally.  Since she is now eating/drinking regularly without any further dizziness/weakness we will recommended to increase her Geodon further, however, at this time she is declining this.  We will continue to monitor.     MDD single episode severe with psychotic features - postpartum onset -Continue Lexapro 10 mg daily -Continue Geodon 20 mg BID with food (resists additional dose increase at this time)   Low Ferritin and low Fe+ Low Vitamin D -Continue Ensure BID -Continue Multivitamin with iron -Continue Vitamin D 1000 IU daily  Hypokalemia - Repeating BMP tomorrow and giving K-lor Tammy Rosales X2   -Continue PRN's: Tylenol, Maalox, Atarax, Milk of Magnesia, Trazodone   Labs on Admission: CMP: WNL,  CBC: WNL except  Hem: 11.5,  HCT:35.7,  MCV:  79.2,  MCH: 25.5,  Mag: 1.9,  UDS: Neg,  Preg Urine: Neg,  TSH: 1.753,  Lipid Panel: WNL except LDL: 110,  A1c: 5.7,  Prolactin: 15.7,  EtOH: Neg,  EKG: Sinus Bradycardia with Qtc: 438. Labs (2/26): Ferritin: 5,  Iron and TIBC: WNL except  Iron:20,  Sat Ratio: 5,  Vit B12:546,  Vit D: 9.88  06-17-1982, DO, PGY2 12/27/2021, 6:44 PM

## 2021-12-27 NOTE — [facility] Suicide Risk Assessment (Signed)
[facility] INPATIENT:  Family/Significant Other Suicide Prevention Education  Suicide Prevention Education:  Contact Attempts: Tammy Rosales [phone] (Mother) has been identified by the patient as the family member/significant other with whom the patient will be residing, and identified as the person(s) who will aid the patient in the event of a mental health crisis.  With written consent from the patient, two attempts were made to provide suicide prevention education, prior to and/or following the patient's discharge.  We were unsuccessful in providing suicide prevention education.  A suicide education pamphlet was given to the patient to share with family/significant other.  Date and time of first attempt: 12/26/2021 at 11:45am  Date and time of second attempt: 12/27/2021 at 1:00pm   Message states that call cannot be completed at this time.  There is no voicemail available.  CSW was unable to leave a message at this time.   Tammy Rosales 12/27/2021, 1:29 PM

## 2021-12-28 ENCOUNTER — Encounter (HOSPITAL_COMMUNITY): Payer: Self-pay

## 2021-12-28 MED ORDER — ZIPRASIDONE HCL 40 MG PO CAPS
40.0000 mg | ORAL_CAPSULE | Freq: Two times a day (BID) | ORAL | Status: DC
  Administered 2021-12-28 – 2022-01-01 (×8): 40 mg via ORAL
  Filled 2021-12-28 (×13): qty 1

## 2021-12-28 NOTE — Group Note (Signed)
LCSW Group Therapy Note ? ?Group Date: 12/28/2021 ?Start Time: 1300 ?End Time: 1400 ? ? ?Type of Therapy and Topic:  Group Therapy: Using "I" Statements ? ?Participation Level:  Active ? ?Description of Group:  ?Patients were asked to provide details of some interpersonal conflicts they have experienced. Patients were then educated about ?I? statements, communication which focuses on feelings or views of the speaker rather than what the other person is doing. T group members were asked to reflect on past conflicts and to provide specific examples for utilizing ?I? statements. ? ?Therapeutic Goals: ? ?Patients will verbalize understanding of ineffective communication and effective communication. ?Patients will be able to empathize with whom they are having conflict. ?Patients will practice effective communication in the form of ?I? statements. ? ? ? ?Summary of Patient Progress:  Tammy Rosales shared different ways to have conversations using "I statements." The patient was present/active throughout the session and proved open to feedback from CSW and peers. Patient demonstrated good insight into the subject matter, was respectful of peers, and was present throughout the entire session. ? ?Therapeutic Modalities:   ?Cognitive Behavioral Therapy ?Solution-Focused Therapy ? ? ? ?Deston Bilyeu E Toniqua Melamed, LCSW ?12/28/2021  2:17 PM   ? ?

## 2021-12-28 NOTE — Progress Notes (Signed)
?   12/27/21 2016  ?Psych Admission Type (Psych Patients Only)  ?Admission Status Voluntary  ?Psychosocial Assessment  ?Patient Complaints None  ?Eye Contact Brief  ?Facial Expression Flat  ?Affect Flat  ?Speech Soft;Slow  ?Interaction Avoidant;No initiation  ?Motor Activity Slow  ?Appearance/Hygiene Unremarkable  ?Behavior Characteristics Cooperative;Calm  ?Mood Pleasant  ?Thought Process  ?Coherency WDL  ?Content WDL  ?Delusions None reported or observed  ?Perception WDL  ?Hallucination None reported or observed  ?Judgment Impaired  ?Confusion None  ?Danger to Self  ?Current suicidal ideation? Denies  ?Danger to Others  ?Danger to Others None reported or observed  ? ?Tammy Rosales was more visible on the unit this evening.  She did attend evening wrap up group.  She denied SI/HI or AVH.  She stated her day was "good."  She denied any depression or anxiety.  She took her scheduled Potassium without difficulty.  No prn medications requested.  Q 15 minutes maintained for safety. ?

## 2021-12-28 NOTE — Progress Notes (Signed)
Adult Psychoeducational Group Note ? ?Date:  12/28/2021 ?Time:  8:37 PM ? ?Group Topic/Focus:  ?Wrap-Up Group:   The focus of this group is to help patients review their daily goal of treatment and discuss progress on daily workbooks. ? ?Participation Level:  Active ? ?Participation Quality:  Appropriate ? ?Affect:  Appropriate ? ?Cognitive:  Appropriate ? ?Insight: Appropriate ? ?Engagement in Group:  Engaged ? ?Modes of Intervention:  Discussion ? ?Additional Comments:patient said her day was 9. Her goa l communicating. She achieved her goal.coping skills breathing tec h.   ? ?Charna Busman Long ?12/28/2021, 8:37 PM ?

## 2021-12-28 NOTE — Group Note (Signed)
Recreation Therapy Group Note ? ? ?Group Topic:Stress Management  ?Group Date: 12/28/2021 ?Start Time: 0930 ?End Time: 0950 ?Facilitators: Caroll Rancher, LRT,CTRS ?Location: [address] Dayroom ? ? ?Goal Area(s) Addresses:  ?Patient will actively participate in stress management techniques presented during session.  ?Patient will successfully identify benefit of practicing stress management post d/c.  ? ?Group Description: Guided Imagery. LRT provided education, instruction, and demonstration on practice of visualization via guided imagery. Patient was asked to participate in the technique introduced during session. LRT debriefed including topics of mindfulness, stress management and specific scenarios each patient could use these techniques. Patients were given suggestions of ways to access scripts post d/c and encouraged to explore Youtube and other apps available on smartphones, tablets, and computers. ? ? ?Affect/Mood: N/A ?  ?Participation Level: Did not attend ?  ? ?Clinical Observations/Individualized Feedback:   ? ? ?Plan: Continue to engage patient in RT group sessions 2-3x/week. ? ? ?Caroll Rancher, LRT,CTRS ?12/28/2021 11:45 AM ?

## 2021-12-28 NOTE — [facility] Counselor (Addendum)
CSW spoke with the Pt and explained that the number she provided for her mother is not working.  CSW explained that she found a number in the chart for her grandmother, who brought her to the hospital, and asked the Pt if CSW could contact her.  The Pt stated that she does not remember who brought her to the hospital but agrees to the CSW being able to contact her grandmother.  CSW asked if her grandmother was named Tammy Rosales (named found in chart).  The Pt stated that she does not know an Tammy Rosales.  The Pt states that her grandmother's name is Tammy Rosales [phone].  She also states that the number in the chart that was listed for Tammy Rosales is her mother's phone number. Tammy Rosales [phone].  CSW will attempt to contact these 2 phone numbers to reach a family member to complete SPE contact.  ?

## 2021-12-28 NOTE — Plan of Care (Signed)
Nurse discussed anxiety, depression and coping skills with patient.  

## 2021-12-28 NOTE — Progress Notes (Addendum)
[facility] MD Progress Note  12/28/2021 4:35 PM Tammy Rosales  MRN:  [id number] Subjective:   Tammy "Linzie Collin" Heyliger is a 21 yr old female who presented to [facility] on 2/23 with her grandmother for symptoms of worsening depression, she was admitted to [facility] on 2/24.  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   Case was discussed in the multidisciplinary team. MAR was reviewed and patient was compliant with medications.  She did not receive any PRN medications.   Psychiatric Team made the following recommendations yesterday: -Continue Lexapro 10 mg daily -Continue Geodon 20 mg BID (recommend increase to 40 BID but patient declined) -Continue Agitation Protocol: Zyprexa/Ativan/Geodon -Continue Ensure BID -Continue Multivitamin with iron -Continue Vitamin D 1000 IU daily  On interview patient reports she slept well last night.  She reports her appetite is doing good.  She reports no SI or HI. She reports no issues with her medications.  Discussed with her that we had started Geodon because of the bizarre behaviors she had on admission and that while these were improving we would recommend further increase in her Geodon to fully control her symptoms and she was agreeable to this.  Discussed with her that since it does seem like the psychosis was caused by the depression she would most likely be able to stop taking the Geodon once her depression was under better control.  She states that she was hearing voices on admission but could not remember what they were saying.  She states she was very sleep deprived because of her newborn and not eating.  She did report that her thoughts do seem to be improving.  She denies current ideas of reference, paranoia, first rank symptoms or delusional thinking and denies AVH.  Encouraged her to attend groups which she said she would attempt to do.  She reports no other concerns at present.   Principal Problem: MDD (major depressive disorder), single episode, severe with  psychotic features (HCC) Diagnosis: Principal Problem:   MDD (major depressive disorder), single episode, severe with psychotic features (HCC) Active Problems:   Vitamin D deficiency  Total Time spent with patient:  I personally spent 30 minutes on the unit in direct patient care. The direct patient care time included face-to-face time with the patient, reviewing the patient's chart, communicating with other professionals, and coordinating care. Greater than 50% of this time was spent in counseling or coordinating care with the patient regarding goals of hospitalization, psycho-education, and discharge planning needs.   Past Psychiatric History: Post-Partum Depression  Past Medical History:  Past Medical History:  Diagnosis Date   History of preterm labor 06/06/2021   SVD 03/2021 at 33 weeks   Medical history non-contributory     Past Surgical History:  Procedure Laterality Date   DILATION AND CURETTAGE OF UTERUS     NO PAST SURGERIES     Family History:  Family History  Problem Relation Age of Onset   Hypertension Maternal Aunt    Family Psychiatric  History:  Reports no known Diagnosis', Substance Abuse, or Suicides Social History:  Social History   Substance and Sexual Activity  Alcohol Use Not Currently     Social History   Substance and Sexual Activity  Drug Use Not Currently    Social History   Socioeconomic History   Marital status: Single    Spouse name: Not on file   Number of children: Not on file   Years of education: Not on file   Highest education level: Not on file  Occupational History   Not on file  Tobacco Use   Smoking status: Never   Smokeless tobacco: Never  Vaping Use   Vaping Use: Never used  Substance and Sexual Activity   Alcohol use: Not Currently   Drug use: Not Currently   Sexual activity: Not Currently  Other Topics Concern   Not on file  Social History Narrative   Not on file   Social Determinants of Health   Financial  Resource Strain: Not on file  Food Insecurity: Food Insecurity Present   Worried About Running Out of Food in the Last Year: Sometimes true   Ran Out of Food in the Last Year: Sometimes true  Transportation Needs: Unmet Transportation Needs   Lack of Transportation (Medical): Yes   Lack of Transportation (Non-Medical): Yes  Physical Activity: Not on file  Stress: Not on file  Social Connections: Not on file    Sleep: Good  Appetite:  Good  Current Medications: Current Facility-Administered Medications  Medication Dose Route Frequency Provider Last Rate Last Admin   acetaminophen (TYLENOL) tablet 650 mg  650 mg Oral Q6H PRN Lenard LanceAllen, Tina L, FNP       alum & mag hydroxide-simeth (MAALOX/MYLANTA) 200-200-20 MG/5ML suspension 30 mL  30 mL Oral Q4H PRN Lenard LanceAllen, Tina L, FNP       escitalopram (LEXAPRO) tablet 10 mg  10 mg Oral Daily Lenard LanceAllen, Tina L, FNP   10 mg at 12/28/21 0827   feeding supplement (ENSURE ENLIVE / ENSURE PLUS) liquid 237 mL  237 mL Oral BID BM Doda, Vandana, MD   237 mL at 12/28/21 1141   hydrOXYzine (ATARAX) tablet 25 mg  25 mg Oral TID PRN Lenard LanceAllen, Tina L, FNP       OLANZapine zydis (ZYPREXA) disintegrating tablet 5 mg  5 mg Oral Q8H PRN Pashayan, Mardelle MatteAlexander S, MD       And   LORazepam (ATIVAN) tablet 1 mg  1 mg Oral PRN Lauro FranklinPashayan, Alexander S, MD       And   ziprasidone (GEODON) injection 20 mg  20 mg Intramuscular PRN Pashayan, Mardelle MatteAlexander S, MD       magnesium hydroxide (MILK OF MAGNESIA) suspension 30 mL  30 mL Oral Daily PRN Lenard LanceAllen, Tina L, FNP       multivitamins with iron tablet 1 tablet  1 tablet Oral Daily Karsten Rooda, Vandana, MD   1 tablet at 12/28/21 0827   traZODone (DESYREL) tablet 50 mg  50 mg Oral QHS PRN Lenard LanceAllen, Tina L, FNP       Vitamin D3 (Vitamin D) tablet 1,000 Units  1,000 Units Oral Daily Karsten Rooda, Vandana, MD   1,000 Units at 12/28/21 [id number]   ziprasidone (GEODON) capsule 40 mg  40 mg Oral BID WC Lauro FranklinPashayan, Alexander S, MD        Lab Results:  Results for orders placed  or performed during the hospital encounter of 12/22/21 (from the past 48 hour(s))  Basic metabolic panel     Status: Abnormal   Collection Time: 12/26/21  6:26 PM  Result Value Ref Range   Sodium 138 135 - 145 mmol/L   Potassium 2.7 (LL) 3.5 - 5.1 mmol/L    Comment: CRITICAL RESULT CALLED TO, READ BACK BY AND VERIFIED WITH: MAYA RAMOS RN  12/26/21 @ 2000 VS    Chloride 107 98 - 111 mmol/L   CO2 25 22 - 32 mmol/L   Glucose, Bld 68 (L) 70 - 99 mg/dL    Comment: Glucose reference range  applies only to samples taken after fasting for at least 8 hours.   BUN 10 6 - 20 mg/dL   Creatinine, Ser 9.48 0.44 - 1.00 mg/dL   Calcium 8.9 8.9 - 54.6 mg/dL   GFR, Estimated >27 >03 mL/min    Comment: (NOTE) Calculated using the CKD-EPI Creatinine Equation (2021)    Anion gap 6 5 - 15    Comment: Performed at [facility], [address] [place], [place] [address]  CBC     Status: Abnormal   Collection Time: 12/26/21  6:26 PM  Result Value Ref Range   WBC 5.1 4.0 - 10.5 K/uL   RBC 4.72 3.87 - 5.11 MIL/uL   Hemoglobin 12.2 12.0 - 15.0 g/dL   HCT 81.8 29.9 - 37.1 %   MCV 81.6 80.0 - 100.0 fL   MCH 25.8 (L) 26.0 - 34.0 pg   MCHC 31.7 30.0 - 36.0 g/dL   RDW 69.6 78.9 - 38.1 %   Platelets 248 150 - 400 K/uL   nRBC 0.0 0.0 - 0.2 %    Comment: Performed at [facility], [address] [place], [place] [address]  Magnesium     Status: None   Collection Time: 12/26/21  6:26 PM  Result Value Ref Range   Magnesium 1.8 1.7 - 2.4 mg/dL    Comment: Performed at [facility], [address], [place], [place] [address]  Urinalysis, Complete w Microscopic Urine, Clean Catch     Status: Abnormal   Collection Time: 12/27/21 10:18 AM  Result Value Ref Range   Color, Urine YELLOW YELLOW   APPearance TURBID (A) CLEAR   Specific Gravity, Urine 1.032 (H) 1.005 - 1.030   pH 6.0 5.0 - 8.0   Glucose, UA NEGATIVE NEGATIVE mg/dL   Hgb urine dipstick NEGATIVE  NEGATIVE   Bilirubin Urine NEGATIVE NEGATIVE   Ketones, ur 5 (A) NEGATIVE mg/dL   Protein, ur 30 (A) NEGATIVE mg/dL   Nitrite NEGATIVE NEGATIVE   Leukocytes,Ua NEGATIVE NEGATIVE   RBC / HPF 0-5 0 - 5 RBC/hpf   Bacteria, UA RARE (A) NONE SEEN   Mucus PRESENT     Comment: Performed at [facility], [address], [place], [place] [address]  Glucose, capillary     Status: None   Collection Time: 12/27/21 11:52 AM  Result Value Ref Range   Glucose-Capillary 79 70 - 99 mg/dL    Comment: Glucose reference range applies only to samples taken after fasting for at least 8 hours.   Comment 1 Notify RN     Blood Alcohol level:  Lab Results  Component Value Date   ETH <10 12/22/2021    Metabolic Disorder Labs: Lab Results  Component Value Date   HGBA1C 5.7 (H) 12/22/2021   MPG 117 12/22/2021   Lab Results  Component Value Date   PROLACTIN 15.7 12/22/2021   Lab Results  Component Value Date   CHOL 177 12/22/2021   TRIG 43 12/22/2021   HDL 58 12/22/2021   CHOLHDL 3.1 12/22/2021   VLDL 9 12/22/2021   LDLCALC 110 (H) 12/22/2021    Physical Findings: AIMS: Facial and Oral Movements Muscles of Facial Expression: None, normal Lips and Perioral Area: None, normal Jaw: None, normal Tongue: None, normal,Extremity Movements Upper (arms, wrists, hands, fingers): None, normal Lower (legs, knees, ankles, toes): None, normal, Trunk Movements Neck, shoulders, hips: None, normal, Overall Severity Severity of abnormal movements (highest score from questions above): None, normal  Incapacitation due to abnormal movements: None, normal Patient's awareness of abnormal movements (rate only patient's report): No Awareness, Dental Status Current problems with teeth and/or dentures?: No Does patient usually wear dentures?: No  CIWA:    COWS:     Musculoskeletal: Strength & Muscle Tone: within normal limits Gait & Station: normal Patient leans: N/A  Psychiatric Specialty  Exam:  Presentation  General Appearance: Appropriate for Environment; Casual; Fairly Groomed  Eye Contact:Fair  Speech:Clear and Coherent with occasional hesitancy prior to answering (questionable whether thought blocking or Poverty of Speech but is answering questions with short responses today) - nonspontaneous  Speech Volume:Normal  Handedness:Right   Mood and Affect  Mood:-- ("good") but appears aloof  Affect:Constricted but did have occasions of smiling at appropriate times during exam, guarded appearing  Thought Process  Thought Processes: Concrete, evasive and vague  Descriptions of Associations:Intact  Orientation:Full (Time, Place and Person)  Thought Content: Denies current AVH, paranoia, ideas of reference or first rank symptoms but is guarded on exam; questionable thought blocking at times versus poverty of thought during interview  Hallucinations:Hallucinations: None  Ideas of Reference:None  Suicidal Thoughts:Suicidal Thoughts: No  Homicidal Thoughts:Homicidal Thoughts: No   Sensorium  Memory:Immediate Fair; Recent Fair  Judgment:Poor (improving)  Insight:Poor (improving)   Executive Functions  Concentration:Fair  Attention Span:Fair  Recall:Fair  Fund of Knowledge:Fair  Language:Fair   Psychomotor Activity  Psychomotor Activity:Psychomotor Activity: Normal   Assets  Assets:Physical Health; Financial Resources/Insurance; Social Support   Sleep  Sleep:Sleep: Good Number of Hours of Sleep: 6   Physical Exam Vitals reviewed.  HENT:     Head: Normocephalic.  Pulmonary:     Effort: Pulmonary effort is normal.  Neurological:     General: No focal deficit present.     Mental Status: She is alert.   Review of Systems  Respiratory:  Negative for shortness of breath.   Cardiovascular:  Negative for chest pain.  Gastrointestinal:  Negative for constipation, diarrhea, nausea and vomiting.  Neurological:  Negative for dizziness and  headaches.  Blood pressure 94/69, pulse 81, temperature 98.1 F (36.7 C), temperature source Oral, resp. rate 18, height 5\' 4"  (1.626 m), weight 57.6 kg, last menstrual period 11/30/2021, SpO2 94 %, not currently breastfeeding. Body mass index is 21.8 kg/m.   Treatment Plan Summary: Daily contact with patient to assess and evaluate symptoms and progress in treatment and Medication management  Tammy Rosales is a 21 yr old female who [facility] with her grandmother for symptoms of worsening depression with psychotic features on admission. [facility]    Linzie CollinShadria is continuing to improve as she was answering more questions other than yes/no.  At this point it is difficult to distinguish if this is due to poverty of speech or thought blocking.  She has agreed to an increase of her Geodon which will start tonight.  She did have a low K of 2.7 but she took 2 doses of Kdur 40 mEq and will have a redraw of her BMP tonight.  If still low will further supplement.  We will continue to monitor.      MDD single episode severe with psychotic features - r/o with peripartum onset  -Continue Lexapro 10 mg daily -Increase Geodon to 40 mg BID with food (rechecking EKG tomorrow for Qtc monitoring with dose titration)     Low Ferritin and low Fe+ Low Vitamin D -Continue Ensure BID -Continue Multivitamin with iron -Continue Vitamin  D 1000 IU daily - Will need PCP f/u after discharge    Hypokalemia -Received 2 dose Kdur 40 mEq -Recheck BMP 3/1 evening     -Continue PRN's: Tylenol, Maalox, Atarax, Milk of Magnesia, Trazodone     Labs on Admission: CMP: WNL,  CBC: WNL except  Hem: 11.5,  HCT:35.7,  MCV:  79.2,  MCH: 25.5,  Mag: 1.9,  UDS: Neg,  Preg Urine: Neg,  TSH: 1.753,  Lipid Panel: WNL except LDL: 110,  A1c: 5.7,  Prolactin: 15.7,  EtOH: Neg,  EKG: Sinus Bradycardia with Qtc: 438. Labs (2/26): Ferritin: 5,  Iron and TIBC: WNL except  Iron:20,  Sat Ratio:  5,  Vit B12:546,  Vit D: 9.88 Labs (2/27):BMP: WNL except K:2.7,  CBC: WNL except MCH: 25.8,  Mag: 1.8,  UA: Spec Grav: 1.032,  Ket: 5,  Prot: 30,  Bacteria: rare   Rhea Belton, DO PGY2 12/28/2021, 4:35 PM

## 2021-12-28 NOTE — BH IP Treatment Plan (Signed)
Interdisciplinary Treatment and Diagnostic Plan Update ? ?12/28/2021 ?Time of Session: 10:25am  ?Tammy Rosales ?MRN: [id number] ? ?Principal Diagnosis: MDD (major depressive disorder), single episode, severe with psychotic features ([place]) ? ?Secondary Diagnoses: Principal Problem: ?  MDD (major depressive disorder), single episode, severe with psychotic features ([place]) ?Active Problems: ?  Post partum depression ?  Vitamin D deficiency ? ? ?Current Medications:  ?Current Facility-Administered Medications  ?Medication Dose Route Frequency Provider Last Rate Last Admin  ? acetaminophen (TYLENOL) tablet 650 mg  650 mg Oral Q6H PRN Lucky Rathke, FNP      ? alum & mag hydroxide-simeth (MAALOX/MYLANTA) 200-200-20 MG/5ML suspension 30 mL  30 mL Oral Q4H PRN Lucky Rathke, FNP      ? escitalopram (LEXAPRO) tablet 10 mg  10 mg Oral Daily Lucky Rathke, FNP   10 mg at 12/28/21 0827  ? feeding supplement (ENSURE ENLIVE / ENSURE PLUS) liquid 237 mL  237 mL Oral BID BM Doda, Vandana, MD   237 mL at 12/28/21 1141  ? hydrOXYzine (ATARAX) tablet 25 mg  25 mg Oral TID PRN Lucky Rathke, FNP      ? OLANZapine zydis (ZYPREXA) disintegrating tablet 5 mg  5 mg Oral Q8H PRN Pashayan, Redgie Grayer, MD      ? And  ? LORazepam (ATIVAN) tablet 1 mg  1 mg Oral PRN Briant Cedar, MD      ? And  ? ziprasidone (GEODON) injection 20 mg  20 mg Intramuscular PRN Briant Cedar, MD      ? magnesium hydroxide (MILK OF MAGNESIA) suspension 30 mL  30 mL Oral Daily PRN Lucky Rathke, FNP      ? multivitamins with iron tablet 1 tablet  1 tablet Oral Daily Armando Reichert, MD   1 tablet at 12/28/21 0827  ? potassium chloride SA (KLOR-CON M) CR tablet 40 mEq  40 mEq Oral BID Briant Cedar, MD   40 mEq at 12/28/21 [id number]  ? traZODone (DESYREL) tablet 50 mg  50 mg Oral QHS PRN Lucky Rathke, FNP      ? Vitamin D3 (Vitamin D) tablet 1,000 Units  1,000 Units Oral Daily Armando Reichert, MD   1,000 Units at 12/28/21 0826  ? ziprasidone (GEODON)  capsule 20 mg  20 mg Oral BID WC Armando Reichert, MD   20 mg at 12/28/21 0827  ? ?PTA Medications: ?No medications prior to admission.  ? ? ?Patient Stressors: Financial difficulties   ?Other: Transportation Needs   ? ?Patient Strengths: Capable of independent living  ?Communication skills  ?Physical Health  ?Supportive family/friends  ? ?Treatment Modalities: Medication Management, Group therapy, Case management,  ?1 to 1 session with clinician, Psychoeducation, Recreational therapy. ? ? ?Physician Treatment Plan for Primary Diagnosis: MDD (major depressive disorder), single episode, severe with psychotic features ([place]) ?Long Term Goal(s): Improvement in symptoms so as ready for discharge  ? ?Short Term Goals: Ability to identify changes in lifestyle to reduce recurrence of condition will improve ?Ability to verbalize feelings will improve ?Ability to identify and develop effective coping behaviors will improve ?Ability to maintain clinical measurements within normal limits will improve ?Ability to identify triggers associated with substance abuse/mental health issues will improve ? ?Medication Management: Evaluate patient's response, side effects, and tolerance of medication regimen. ? ?Therapeutic Interventions: 1 to 1 sessions, Unit Group sessions and Medication administration. ? ?Evaluation of Outcomes: Progressing ? ?Physician Treatment Plan for Secondary Diagnosis: Principal Problem: ?  MDD (major depressive  disorder), single episode, severe with psychotic features ([place]) ?Active Problems: ?  Post partum depression ?  Vitamin D deficiency ? ?Long Term Goal(s): Improvement in symptoms so as ready for discharge  ? ?Short Term Goals: Ability to identify changes in lifestyle to reduce recurrence of condition will improve ?Ability to verbalize feelings will improve ?Ability to identify and develop effective coping behaviors will improve ?Ability to maintain clinical measurements within normal limits will  improve ?Ability to identify triggers associated with substance abuse/mental health issues will improve    ? ?Medication Management: Evaluate patient's response, side effects, and tolerance of medication regimen. ? ?Therapeutic Interventions: 1 to 1 sessions, Unit Group sessions and Medication administration. ? ?Evaluation of Outcomes: Progressing ? ? ?RN Treatment Plan for Primary Diagnosis: MDD (major depressive disorder), single episode, severe with psychotic features ([place]) ?Long Term Goal(s): Knowledge of disease and therapeutic regimen to maintain health will improve ? ?Short Term Goals: Ability to remain free from injury will improve, Ability to participate in decision making will improve, Ability to verbalize feelings will improve, Ability to disclose and discuss suicidal ideas, and Ability to identify and develop effective coping behaviors will improve ? ?Medication Management: RN will administer medications as ordered by provider, will assess and evaluate patient's response and provide education to patient for prescribed medication. RN will report any adverse and/or side effects to prescribing provider. ? ?Therapeutic Interventions: 1 on 1 counseling sessions, Psychoeducation, Medication administration, Evaluate responses to treatment, Monitor vital signs and CBGs as ordered, Perform/monitor CIWA, COWS, AIMS and Fall Risk screenings as ordered, Perform wound care treatments as ordered. ? ?Evaluation of Outcomes: Progressing ? ? ?LCSW Treatment Plan for Primary Diagnosis: MDD (major depressive disorder), single episode, severe with psychotic features ([place]) ?Long Term Goal(s): Safe transition to appropriate next level of care at discharge, Engage patient in therapeutic group addressing interpersonal concerns. ? ?Short Term Goals: Engage patient in aftercare planning with referrals and resources, Increase social support, Increase emotional regulation, Facilitate acceptance of mental health diagnosis and  concerns, Identify triggers associated with mental health/substance abuse issues, and Increase skills for wellness and recovery ? ?Therapeutic Interventions: Assess for all discharge needs, 1 to 1 time with Education officer, museum, Explore available resources and support systems, Assess for adequacy in community support network, Educate family and significant other(s) on suicide prevention, Complete Psychosocial Assessment, Interpersonal group therapy. ? ?Evaluation of Outcomes: Progressing ? ? ?Progress in Treatment: ?Attending groups: Yes. ?Participating in groups: Yes. ?Taking medication as prescribed: Yes. ?Toleration medication: Yes. ?Family/Significant other contact made: No, will contact:  mother ?Patient understands diagnosis: Yes. ?Discussing patient identified problems/goals with staff: Yes. ?Medical problems stabilized or resolved: Yes. ?Denies suicidal/homicidal ideation: Yes. ?Issues/concerns per patient self-inventory: No. ?Other: None ?  ?New problem(s) identified: No, Describe:  None ?  ?New Short Term/Long Term Goal(s):medication stabilization, elimination of SI thoughts, development of comprehensive mental wellness plan.  ?  ?Patient Goals:  "I have no goal" ?  ?Discharge Plan or Barriers: Patient recently admitted. CSW will continue to follow and assess for appropriate referrals and possible discharge planning.  ?  ?Reason for Continuation of Hospitalization: Delusions  ?Hallucinations ?Medication stabilization ?  ?Estimated Length of Stay: 3-5 days ? ? ?Scribe for Treatment Team: ?Darleen Crocker, [place] ?12/28/2021 ?3:10 PM ?

## 2021-12-28 NOTE — Progress Notes (Signed)
D:  Patient's self inventory sheet, patient sleeps good, no sleep medication.  Good appetite, hyper energy level, good concentration.  Denied depression, hopeless and anxiety.  Denied withdrawals.  Denied SI.  Denied physical problems.  Denied physical pain.  No goal today.  No plans today.  No discharge plans. ?A:  Medications administered per MD orders.  Emotional support and encouragement given patient. ?R:  Denied SI and HI, contracts for safety.  Denied A/V hallucinations.  Safety maintained with 15 minute checks. ? ?

## 2021-12-28 NOTE — [facility] Counselor (Signed)
CSW provided the Pt with information about how to apply for Foodstamps and Medicaid.  CSW also provided the Pt with information about parenting resources, Postpartum Depression, and groups that are available in the [facility] area.  ?

## 2021-12-28 NOTE — [facility] Group Notes (Signed)
Adult Psychoeducational Group Note ? ?Date:  12/28/2021 ?Time:  2:46 PM ? ?Group Topic/Focus:  ?Wellness Toolbox:   The focus of this group is to discuss various aspects of wellness, balancing those aspects and exploring ways to increase the ability to experience wellness.  Patients will create a wellness toolbox for use upon discharge. ? ?Participation Level:  Active ? ?Participation Quality:  Appropriate ? ?Affect:  Appropriate ? ?Cognitive:  Alert ? ?Insight: Appropriate ? ?Engagement in Group:  Engaged ? ?Modes of Intervention:  Activity ? ?Additional Comments:  Patient attended and participated in the relaxation group activity. ? ?Jearl Klinefelter ?12/28/2021, 2:46 PM ?

## 2021-12-29 LAB — BASIC METABOLIC PANEL
Anion gap: 7 (ref 5–15)
BUN: 10 mg/dL (ref 6–20)
CO2: 25 mmol/L (ref 22–32)
Calcium: 9.2 mg/dL (ref 8.9–10.3)
Chloride: 104 mmol/L (ref 98–111)
Creatinine, Ser: 0.75 mg/dL (ref 0.44–1.00)
GFR, Estimated: >60 mL/min (ref >60)
Glucose, Bld: 97 mg/dL (ref 70–99)
Potassium: 4.3 mmol/L (ref 3.5–5.1)
Sodium: 136 mmol/L (ref 135–145)

## 2021-12-29 NOTE — Progress Notes (Signed)
D) Pt received calm, visible, participating in milieu, and in no acute distress. Pt A & O x4. Pt denies SI, HI, A/ V H, depression, anxiety and pain at this time. A) Pt encouraged to drink fluids. Pt encouraged to come to staff with needs. Pt encouraged to attend and participate in groups. Pt encouraged to set reachable goals.  R) Pt remained safe on unit, in no acute distress, will continue to assess.   ? ? ? 12/28/21 1930  ?Psych Admission Type (Psych Patients Only)  ?Admission Status Voluntary  ?Psychosocial Assessment  ?Patient Complaints None  ?Eye Contact Brief  ?Facial Expression Flat  ?Affect Flat  ?Speech Soft  ?Interaction Avoidant  ?Motor Activity Slow  ?Appearance/Hygiene Unremarkable  ?Behavior Characteristics Anxious  ?Mood Anxious  ?Thought Process  ?Coherency WDL  ?Content WDL  ?Delusions None reported or observed  ?Perception WDL  ?Hallucination None reported or observed  ?Judgment Impaired  ?Confusion None  ?Danger to Self  ?Current suicidal ideation? Denies  ?Danger to Others  ?Danger to Others None reported or observed  ? ? ?

## 2021-12-29 NOTE — Group Note (Signed)
Date:  12/29/2021 ?Time:  9:54 AM ? ?Group Topic/Focus:  ?Orientation:   The focus of this group is to educate the patient on the purpose and policies of crisis stabilization and provide a format to answer questions about their admission.  The group details unit policies and expectations of patients while admitted. ? ? ? ?Participation Level:  Active ? ?Participation Quality:  Appropriate ? ?Affect:  Appropriate ? ?Cognitive:  Appropriate ? ?Insight: Appropriate ? ?Engagement in Group:  Engaged ? ?Modes of Intervention:  Discussion ? ?Additional Comments:   ? ?Jaquita Rector ?12/29/2021, 9:54 AM ? ?

## 2021-12-29 NOTE — [facility] Group Notes (Signed)
PT was informed but did not attend group. ?

## 2021-12-29 NOTE — Progress Notes (Signed)
DAR NOTE: Patient presents with a blunted affect and depressed mood.  Denies suicidal thoughts, pain, auditory and visual hallucinations.  Rates depression at 0, hopelessness at 0, and anxiety at 0.  Maintained on routine safety checks.  Medications given as prescribed.  Support and encouragement offered as needed.  Attended group and participated.  States goal for today is "nothing."  Patient is withdrawn and minimal. Offered no complaint.   ? ?

## 2021-12-29 NOTE — Progress Notes (Addendum)
[facility] MD Progress Note  12/29/2021 11:06 AM Kala Ambriz  MRN:  [id number]  Subjective:   [name] "Linzie Collin" Moncure is a 21 yr old female who presented to [facility] on 2/23 with her grandmother for symptoms of worsening depression, she was admitted to [facility] on 2/24.  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  Case was discussed in the multidisciplinary team. MAR was reviewed and patient was compliant with medications.  She did not receive any PRN medications.  Psychiatric Team made the following recommendations yesterday: -Continue Lexapro 10 mg daily -Increase Geodon to 40 mg BID  -Continue Agitation Protocol: Zyprexa/Ativan/Geodon -Continue Ensure BID -Continue Multivitamin with iron -Continue Vitamin D 1000 IU daily  On interview patient reports her appetite is doing good.  She reports her sleep is doing good.  She reports no SI, HI, or AVH.  She reports no Paranoia, Ideas of Reference, or other First Rank symptoms.She reports no issues with her medications.  She reports that she has been going to sleep early and getting restful sleep.  Discussed with her the importance of continuing to eat given her low vitamins and iron levels.  Discussed the importance of following up with her PCP to continue to monitor.  Discussed the importance of attending groups and encouraged her to attend them.  She reports no other concerns at present.   Principal Problem: MDD (major depressive disorder), single episode, severe with psychotic features (HCC) Diagnosis: Principal Problem:   MDD (major depressive disorder), single episode, severe with psychotic features (HCC) Active Problems:   Vitamin D deficiency  Total Time spent with patient:  I personally spent 30 minutes on the unit in direct patient care. The direct patient care time included face-to-face time with the patient, reviewing the patient's chart, communicating with other professionals, and coordinating care. Greater than 50% of this time was spent  in counseling or coordinating care with the patient regarding goals of hospitalization, psycho-education, and discharge planning needs.   Past Psychiatric History: Post-Partum Depression  Past Medical History:  Past Medical History:  Diagnosis Date   History of preterm labor 06/06/2021   SVD 03/2021 at 33 weeks   Medical history non-contributory     Past Surgical History:  Procedure Laterality Date   DILATION AND CURETTAGE OF UTERUS     NO PAST SURGERIES     Family History:  Family History  Problem Relation Age of Onset   Hypertension Maternal Aunt    Family Psychiatric  History:  Reports no known Diagnosis', Substance Abuse, or Suicides  Social History:  Social History   Substance and Sexual Activity  Alcohol Use Not Currently     Social History   Substance and Sexual Activity  Drug Use Not Currently    Social History   Socioeconomic History   Marital status: Single    Spouse name: Not on file   Number of children: Not on file   Years of education: Not on file   Highest education level: Not on file  Occupational History   Not on file  Tobacco Use   Smoking status: Never   Smokeless tobacco: Never  Vaping Use   Vaping Use: Never used  Substance and Sexual Activity   Alcohol use: Not Currently   Drug use: Not Currently   Sexual activity: Not Currently  Other Topics Concern   Not on file  Social History Narrative   Not on file   Social Determinants of Health   Financial Resource Strain: Not on file  Food Insecurity:  Food Insecurity Present   Worried About Programme researcher, broadcasting/film/video in the Last Year: Sometimes true   Ran Out of Food in the Last Year: Sometimes true  Transportation Needs: Personal assistant (Medical): Yes   Lack of Transportation (Non-Medical): Yes  Physical Activity: Not on file  Stress: Not on file  Social Connections: Not on file    Sleep: Good  Appetite:  Good  Current Medications: Current  Facility-Administered Medications  Medication Dose Route Frequency Provider Last Rate Last Admin   acetaminophen (TYLENOL) tablet 650 mg  650 mg Oral Q6H PRN Lenard Lance, FNP       alum & mag hydroxide-simeth (MAALOX/MYLANTA) 200-200-20 MG/5ML suspension 30 mL  30 mL Oral Q4H PRN Lenard Lance, FNP       escitalopram (LEXAPRO) tablet 10 mg  10 mg Oral Daily Lenard Lance, FNP   10 mg at 12/29/21 6734   feeding supplement (ENSURE ENLIVE / ENSURE PLUS) liquid 237 mL  237 mL Oral BID BM Doda, Vandana, MD   237 mL at 12/28/21 1420   hydrOXYzine (ATARAX) tablet 25 mg  25 mg Oral TID PRN Lenard Lance, FNP       OLANZapine zydis (ZYPREXA) disintegrating tablet 5 mg  5 mg Oral Q8H PRN Pashayan, Mardelle Matte, MD       And   LORazepam (ATIVAN) tablet 1 mg  1 mg Oral PRN Lauro Franklin, MD       And   ziprasidone (GEODON) injection 20 mg  20 mg Intramuscular PRN Lauro Franklin, MD       magnesium hydroxide (MILK OF MAGNESIA) suspension 30 mL  30 mL Oral Daily PRN Lenard Lance, FNP       multivitamins with iron tablet 1 tablet  1 tablet Oral Daily Karsten Ro, MD   1 tablet at 12/29/21 1937   traZODone (DESYREL) tablet 50 mg  50 mg Oral QHS PRN Lenard Lance, FNP       Vitamin D3 (Vitamin D) tablet 1,000 Units  1,000 Units Oral Daily Karsten Ro, MD   1,000 Units at 12/29/21 9024   ziprasidone (GEODON) capsule 40 mg  40 mg Oral BID WC Lauro Franklin, MD   40 mg at 12/29/21 0973    Lab Results:  Results for orders placed or performed during the hospital encounter of 12/22/21 (from the past 48 hour(s))  Glucose, capillary     Status: None   Collection Time: 12/27/21 11:52 AM  Result Value Ref Range   Glucose-Capillary 79 70 - 99 mg/dL    Comment: Glucose reference range applies only to samples taken after fasting for at least 8 hours.   Comment 1 Notify RN   Basic metabolic panel     Status: None   Collection Time: 12/29/21  6:21 AM  Result Value Ref Range   Sodium 136 135  - 145 mmol/L   Potassium 4.3 3.5 - 5.1 mmol/L   Chloride 104 98 - 111 mmol/L   CO2 25 22 - 32 mmol/L   Glucose, Bld 97 70 - 99 mg/dL    Comment: Glucose reference range applies only to samples taken after fasting for at least 8 hours.   BUN 10 6 - 20 mg/dL   Creatinine, Ser 5.32 0.44 - 1.00 mg/dL   Calcium 9.2 8.9 - 99.2 mg/dL   GFR, Estimated >42 >68 mL/min    Comment: (NOTE) Calculated using the CKD-EPI Creatinine  Equation (2021)    Anion gap 7 5 - 15    Comment: Performed at [facility], [address], [place], [place] [address]    Blood Alcohol level:  Lab Results  Component Value Date   ETH <10 12/22/2021    Metabolic Disorder Labs: Lab Results  Component Value Date   HGBA1C 5.7 (H) 12/22/2021   MPG 117 12/22/2021   Lab Results  Component Value Date   PROLACTIN 15.7 12/22/2021   Lab Results  Component Value Date   CHOL 177 12/22/2021   TRIG 43 12/22/2021   HDL 58 12/22/2021   CHOLHDL 3.1 12/22/2021   VLDL 9 12/22/2021   LDLCALC 110 (H) 12/22/2021    Physical Findings: AIMS: Facial and Oral Movements Muscles of Facial Expression: None, normal Lips and Perioral Area: None, normal Jaw: None, normal Tongue: None, normal,Extremity Movements Upper (arms, wrists, hands, fingers): None, normal Lower (legs, knees, ankles, toes): None, normal, Trunk Movements Neck, shoulders, hips: None, normal, Overall Severity Severity of abnormal movements (highest score from questions above): None, normal Incapacitation due to abnormal movements: None, normal Patient's awareness of abnormal movements (rate only patient's report): No Awareness, Dental Status Current problems with teeth and/or dentures?: No Does patient usually wear dentures?: No   Musculoskeletal: Strength & Muscle Tone: within normal limits Gait & Station: normal Patient leans: N/A  Psychiatric Specialty Exam:  Presentation  General Appearance: Appropriate for Environment; Casual;  Fairly Groomed  Eye Contact:Good  Speech:Clear and Coherent with more spontaneous and more elaborative answers today  Speech Volume:Normal  Handedness:Right   Mood and Affect  Mood:-- ("good") - appears less dysphoric today  Affect:able to smile at appropriate times today, brighter appearing  Thought Process  Thought Processes: Concrete but linear  Descriptions of Associations:Intact  Orientation:Full (Time, Place and Person)  Thought Content: Denies current AVH, paranoia, ideas of reference or first rank symptoms and appears less guarded on exam; is not grossly responding to internal/external stimuli on exam  Hallucinations:Hallucinations: None  Ideas of Reference:None  Suicidal Thoughts:Suicidal Thoughts: No  Homicidal Thoughts:Homicidal Thoughts: No   Sensorium  Memory:Immediate Fair; Recent Fair  Judgment:Fair  Insight:Shallow   Executive Functions  Concentration:Fair  Attention Span:Fair  Recall:Fair  Fund of Knowledge:Fair  Language:Fair   Psychomotor Activity  Psychomotor Activity:Normal, no cogwheeling, no stiffness, no tremor, AIMS 0   Assets  Assets:Physical Health; Financial Resources/Insurance; Social Support   Sleep  Total time unrecorded   Physical Exam Vitals and nursing note reviewed.  Constitutional:      General: She is not in acute distress.    Appearance: Normal appearance. She is normal weight. She is not ill-appearing or toxic-appearing.  HENT:     Head: Normocephalic and atraumatic.  Pulmonary:     Effort: Pulmonary effort is normal.  Musculoskeletal:        General: Normal range of motion.  Neurological:     General: No focal deficit present.     Mental Status: She is alert.   Review of Systems  Respiratory:  Negative for cough and shortness of breath.   Cardiovascular:  Negative for chest pain.  Gastrointestinal:  Negative for abdominal pain, constipation, diarrhea, nausea and vomiting.  Neurological:   Negative for dizziness, weakness and headaches.  Psychiatric/Behavioral:  Negative for depression, hallucinations and suicidal ideas. The patient is not nervous/anxious.   Blood pressure 91/66, pulse 83, temperature 98 F (36.7 C), temperature source Oral, resp. rate 18, height 5\' 4"  (1.626 m), weight 57.6 kg, last  menstrual period 11/30/2021, SpO2 100 %, not currently breastfeeding. Body mass index is 21.8 kg/m.   Treatment Plan Summary: Daily contact with patient to assess and evaluate symptoms and progress in treatment and Medication management  [name] "Linzie CollinShadria" Yetta BarreJones is a 21 yr old female who [facility] with her grandmother for symptoms of worsening depression with psychotic features on admission. [facility]    Linzie CollinShadria has had improvement because she had more affect and smiled today during interview.   We will not make any medication changes at this time.  We will contact patient's mother tomorrow to see if they think patient is returning towards self.  Her repeat BMP shows that her hypokalemia has resolved as K: 4.3.  We will continue to monitor.   MDD single episode severe with psychotic features - r/o with peripartum onset  -Continue Lexapro 10 mg daily -Continue Geodon 40 mg BID with food (repeat EKG shows QTC 436ms)     Low Ferritin and low Fe+ Low Vitamin D -Continue Ensure BID -Continue Multivitamin with iron -Continue Vitamin D 1000 IU daily - Will need PCP f/u after discharge    Hypokalemia (resolved) -K: 4.3     -Continue PRN's: Tylenol, Maalox, Atarax, Milk of Magnesia, Trazodone     Labs on Admission: CMP: WNL,  CBC: WNL except  Hem: 11.5,  HCT:35.7,  MCV:  79.2,  MCH: 25.5,  Mag: 1.9,  UDS: Neg,  Preg Urine: Neg,  TSH: 1.753,  Lipid Panel: WNL except LDL: 110,  A1c: 5.7,  Prolactin: 15.7,  EtOH: Neg,  EKG: Sinus Bradycardia with Qtc: 438. Labs (2/26): Ferritin: 5,  Iron and TIBC: WNL except  Iron:20,  Sat Ratio: 5,  Vit  B12:546,  Vit D: 9.88 Labs (2/27):BMP: WNL except K:2.7,  CBC: WNL except MCH: 25.8,  Mag: 1.8,  UA: Spec Grav: 1.032,  Ket: 5,  Prot: 30,  Bacteria: rare Labs (3/2): BMP: WNL   Rhea BeltonAlexander Pashayan, DO PGY2 12/29/2021, 11:06 AM

## 2021-12-29 NOTE — [facility] Suicide Risk Assessment (Signed)
[facility] INPATIENT:  Family/Significant Other Suicide Prevention Education ? ?Suicide Prevention Education:  ?Education Completed; Tammy Rosales [phone] (Mother) has been identified by the patient as the family member/significant other with whom the patient will be residing, and identified as the person(s) who will aid the patient in the event of a mental health crisis (suicidal ideations/suicide attempt).  With written consent from the patient, the family member/significant other has been provided the following suicide prevention education, prior to the and/or following the discharge of the patient. ? ?The suicide prevention education provided includes the following: ?Suicide risk factors ?Suicide prevention and interventions ?National Suicide Hotline telephone number ?[facility] assessment telephone number ?[facility] Emergency Assistance 911 ?[place] and/or Residential Mobile Crisis Unit telephone number ? ?Request made of family/significant other to: ?Remove weapons (e.g., guns, rifles, knives), all items previously/currently identified as safety concern.   ?Remove drugs/medications (over-the-counter, prescriptions, illicit drugs), all items previously/currently identified as a safety concern. ? ?The family member/significant other verbalizes understanding of the suicide prevention education information provided.  The family member/significant other agrees to remove the items of safety concern listed above. ? ?CSW spoke with [name] who states that prior to hospitalization her daughter was taking her baby and walking at night with no clothing on and in the middle of the road.  She states that her daughter is normally very talkative and maintains a clean house but recently has not been cleaning the home and has not been talking to anyone.  She states that the last time her daughter left the home the Police had to look for her with search dogs and when they found her the baby  was naked and wet.  She states that her daughter was unable to tell her where she went during that time.  [name] states that she has spoken with and visited with her daughter since being in the hospital and states that she is doing much better.  She states that her daughter can come home after discharge and that there are no firearms in the home.  [name] states that the baby will be staying at the grandmother's home for the time being and that she will be introducing the baby back into her daughter's life slowly.  CSW completed SPE with [name].  ? ?Tammy Rosales ?12/29/2021, 10:55 AM ?

## 2021-12-30 NOTE — Progress Notes (Addendum)
[facility] MD Progress Note  12/30/2021 5:21 PM Tammy Rosales  MRN:  [id number]  Subjective:   Tammy Rosales is a 21 yr old female who presented to [facility] on 2/23 with her grandmother for symptoms of worsening depression, she was admitted to [facility] on 2/24.  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  Case was discussed in the multidisciplinary team. MAR was reviewed and patient was compliant with medications.  She did not receive any PRN medications.   Psychiatric Team made the following recommendations yesterday: -Continue Lexapro 10 mg daily -Continue Geodon 40 mg BID  -Continue Agitation Protocol: Zyprexa/Ativan/Geodon -Continue Ensure BID -Continue Multivitamin with iron -Continue Vitamin D 1000 IU daily  On interview patient reports she slept well last night.  She reports her appetite is doing good.  She reports no SI, HI, or AVH. She denies ideas of reference or first rank symptoms. She reports no issues with her medications.  She reports that her mom visited last night.  When asked how this went she states that it went fairly well and they talked about a lot of different things and it was just good to see her because she missed her.  She states she feels like the medications have been very helpful because she feels like a weight has been lifted off her shoulders and that she is feeling happy again.  She discussed skills she is learning in group including breathing techniques. Discussed that I would like to speak with her mother for a better idea of how she is sounding and she was agreeable with this.  She reports no other concerns at present.  Called patient's mother.  Asked her how she felt the patient was doing and she states that the patient is acting a whole lot better and more like she used to.  She states that last night when she came to visit the patient they had a full conversation where as prior to admission the patient would not say anything and would just stare.  When asked  to rate the improvement of the patient she reported she feels that the patient is about 80% of herself.  She then asked what they should do to help the patient with patient's child.  Discussed that patient should have help and supervision with the child for the next several weeks after discharge. Discussed that if patient gets overwhelmed and needs to step away from the situation, we want to make sure there can always be someone to continue to take care of the child.  She had no other concerns.   Principal Problem: MDD (major depressive disorder), single episode, severe with psychotic features (HCC) Diagnosis: Principal Problem:   MDD (major depressive disorder), single episode, severe with psychotic features (HCC) Active Problems:   Vitamin D deficiency  Total Time spent with patient:  I personally spent 30 minutes on the unit in direct patient care. The direct patient care time included face-to-face time with the patient, reviewing the patient's chart, communicating with other professionals, and coordinating care. Greater than 50% of this time was spent in counseling or coordinating care with the patient regarding goals of hospitalization, psycho-education, and discharge planning needs.   Past Psychiatric History: Post-Partum Depression  Past Medical History:  Past Medical History:  Diagnosis Date   History of preterm labor 06/06/2021   SVD 03/2021 at 33 weeks   Medical history non-contributory     Past Surgical History:  Procedure Laterality Date   DILATION AND CURETTAGE OF UTERUS  NO PAST SURGERIES     Family History:  Family History  Problem Relation Age of Onset   Hypertension Maternal Aunt    Family Psychiatric  History: Reports no known Diagnosis', Substance Abuse, or Suicides  Social History:  Social History   Substance and Sexual Activity  Alcohol Use Not Currently     Social History   Substance and Sexual Activity  Drug Use Not Currently    Social History    Socioeconomic History   Marital status: Single    Spouse name: Not on file   Number of children: Not on file   Years of education: Not on file   Highest education level: Not on file  Occupational History   Not on file  Tobacco Use   Smoking status: Never   Smokeless tobacco: Never  Vaping Use   Vaping Use: Never used  Substance and Sexual Activity   Alcohol use: Not Currently   Drug use: Not Currently   Sexual activity: Not Currently  Other Topics Concern   Not on file  Social History Narrative   Not on file   Social Determinants of Health   Financial Resource Strain: Not on file  Food Insecurity: Food Insecurity Present   Worried About Running Out of Food in the Last Year: Sometimes true   Ran Out of Food in the Last Year: Sometimes true  Transportation Needs: Unmet Transportation Needs   Lack of Transportation (Medical): Yes   Lack of Transportation (Non-Medical): Yes  Physical Activity: Not on file  Stress: Not on file  Social Connections: Not on file   Sleep: Good  Appetite:  Good  Current Medications: Current Facility-Administered Medications  Medication Dose Route Frequency Provider Last Rate Last Admin   acetaminophen (TYLENOL) tablet 650 mg  650 mg Oral Q6H PRN Lenard Lance, FNP       alum & mag hydroxide-simeth (MAALOX/MYLANTA) 200-200-20 MG/5ML suspension 30 mL  30 mL Oral Q4H PRN Lenard Lance, FNP       escitalopram (LEXAPRO) tablet 10 mg  10 mg Oral Daily Lenard Lance, FNP   10 mg at 12/30/21 0920   feeding supplement (ENSURE ENLIVE / ENSURE PLUS) liquid 237 mL  237 mL Oral BID BM Karsten Ro, MD   237 mL at 12/30/21 9030   hydrOXYzine (ATARAX) tablet 25 mg  25 mg Oral TID PRN Lenard Lance, FNP       OLANZapine zydis (ZYPREXA) disintegrating tablet 5 mg  5 mg Oral Q8H PRN Pashayan, Mardelle Matte, MD       And   LORazepam (ATIVAN) tablet 1 mg  1 mg Oral PRN Lauro Franklin, MD       And   ziprasidone (GEODON) injection 20 mg  20 mg  Intramuscular PRN Lauro Franklin, MD       magnesium hydroxide (MILK OF MAGNESIA) suspension 30 mL  30 mL Oral Daily PRN Lenard Lance, FNP       multivitamins with iron tablet 1 tablet  1 tablet Oral Daily Karsten Ro, MD   1 tablet at 12/30/21 0920   traZODone (DESYREL) tablet 50 mg  50 mg Oral QHS PRN Lenard Lance, FNP       Vitamin D3 (Vitamin D) tablet 1,000 Units  1,000 Units Oral Daily Karsten Ro, MD   1,000 Units at 12/30/21 0920   ziprasidone (GEODON) capsule 40 mg  40 mg Oral BID WC Lauro Franklin, MD   40 mg at  12/30/21 0920    Lab Results:  Results for orders placed or performed during the hospital encounter of 12/22/21 (from the past 48 hour(s))  Basic metabolic panel     Status: None   Collection Time: 12/29/21  6:21 AM  Result Value Ref Range   Sodium 136 135 - 145 mmol/L   Potassium 4.3 3.5 - 5.1 mmol/L   Chloride 104 98 - 111 mmol/L   CO2 25 22 - 32 mmol/L   Glucose, Bld 97 70 - 99 mg/dL    Comment: Glucose reference range applies only to samples taken after fasting for at least 8 hours.   BUN 10 6 - 20 mg/dL   Creatinine, Ser 7.820.75 0.44 - 1.00 mg/dL   Calcium 9.2 8.9 - 95.610.3 mg/dL   GFR, Estimated >21>60 >30>60 mL/min    Comment: (NOTE) Calculated using the CKD-EPI Creatinine Equation (2021)    Anion gap 7 5 - 15    Comment: Performed at [facility], [address] [place], [place] [id number]    Blood Alcohol level:  Lab Results  Component Value Date   ETH <10 12/22/2021    Metabolic Disorder Labs: Lab Results  Component Value Date   HGBA1C 5.7 (H) 12/22/2021   MPG 117 12/22/2021   Lab Results  Component Value Date   PROLACTIN 15.7 12/22/2021   Lab Results  Component Value Date   CHOL 177 12/22/2021   TRIG 43 12/22/2021   HDL 58 12/22/2021   CHOLHDL 3.1 12/22/2021   VLDL 9 12/22/2021   LDLCALC 110 (H) 12/22/2021    Physical Findings: AIMS: Facial and Oral Movements Muscles of Facial Expression: None,  normal Lips and Perioral Area: None, normal Jaw: None, normal Tongue: None, normal,Extremity Movements Upper (arms, wrists, hands, fingers): None, normal Lower (legs, knees, ankles, toes): None, normal, Trunk Movements Neck, shoulders, hips: None, normal, Overall Severity Severity of abnormal movements (highest score from questions above): None, normal Incapacitation due to abnormal movements: None, normal Patient's awareness of abnormal movements (rate only patient's report): No Awareness, Dental Status Current problems with teeth and/or dentures?: No Does patient usually wear dentures?: No   Musculoskeletal: Strength & Muscle Tone: within normal limits Gait & Station: normal Patient leans: N/A  Psychiatric Specialty Exam:  Presentation  General Appearance: Appropriate for Environment; Casual; Fairly Groomed  Eye Contact:Good  Speech:Clear, coherent, normal fluency, more elaborative with answers  Speech Volume:Normal  Handedness:Right   Mood and Affect  Mood: - more euthymic, described as "good"  Affect:Congruent (brighter and laughed at appropriate times throughout the interview)  Thought Process  Thought Processes: superficially goal directed, linear  Descriptions of Associations:Intact  Orientation:Full (Time, Place and Person)  Thought Content:Logical No SI, HI, or AVH. No Paranoia, Ideas of Reference, or First Rank symptoms.She is not grossly responding to internal/external stimuli on exam  History of Schizophrenia/Schizoaffective disorder:No  Hallucinations:Hallucinations: None  Ideas of Reference:None  Suicidal Thoughts:Suicidal Thoughts: No  Homicidal Thoughts:Homicidal Thoughts: No   Sensorium  Memory:Immediate Fair; Recent Fair  Judgment:Fair  Insight:Fair   Executive Functions  Concentration:Fair  Attention Span:Fair  Recall:Fair  Fund of Knowledge:Fair  Language:Fair   Psychomotor Activity  Psychomotor Activity:Normal - no  cogwheeling, no stiffness, no tremor, AIMS 0   Assets  Assets:Social Support; Health and safety inspectorinancial Resources/Insurance; Physical Health; Housing   Sleep  Sleep:Sleep: Good Number of Hours of Sleep: 6.25   Physical Exam Vitals and nursing note reviewed.  Constitutional:      General: She is not in acute  distress.    Appearance: Normal appearance. She is normal weight. She is not ill-appearing or toxic-appearing.  HENT:     Head: Normocephalic and atraumatic.  Pulmonary:     Effort: Pulmonary effort is normal.  Musculoskeletal:        General: Normal range of motion.  Neurological:     General: No focal deficit present.     Mental Status: She is alert.   Review of Systems  Respiratory:  Negative for cough and shortness of breath.   Cardiovascular:  Negative for chest pain.  Gastrointestinal:  Negative for abdominal pain, constipation, diarrhea, nausea and vomiting.  Neurological:  Negative for dizziness, weakness and headaches.  Psychiatric/Behavioral:  Negative for depression, hallucinations and suicidal ideas. The patient is not nervous/anxious.   Blood pressure 98/61, pulse 97, temperature 98.1 F (36.7 C), temperature source Oral, resp. rate 18, height 5\' 4"  (1.626 m), weight 57.6 kg, last menstrual period 11/30/2021, SpO2 100 %, not currently breastfeeding. Body mass index is 21.8 kg/m.   Treatment Plan Summary: Daily contact with patient to assess and evaluate symptoms and progress in treatment and Medication management  Tammy "01/28/2022" Kuchera is a 21 yr old female who presented to [facility] on 2/23 with her grandmother for symptoms of worsening depression with psychotic features on admission. She was admitted to [facility] on 2/24.    3/24 has responded well to her medications.  We will not make any medication changes at this time.  EKG obtained yesterday shows NSR with Qtc: 436.  We will plan for discharge tomorrow.  MDD single episode severe with psychotic features - r/o with  peripartum onset  -Continue Lexapro 10 mg daily for depression -Continue Geodon 40 mg BID with food for psychosis   Low Ferritin and low Fe+ Low Vitamin D -Continue Ensure BID -Continue Multivitamin with iron -Continue Vitamin D 1000 IU daily - Will need PCP f/u after discharge     Hypokalemia (resolved) -K: 4.3     -Continue PRN's: Tylenol, Maalox, Atarax, Milk of Magnesia, Trazodone     Labs on Admission: CMP: WNL,  CBC: WNL except  Hem: 11.5,  HCT:35.7,  MCV:  79.2,  MCH: 25.5,  Mag: 1.9,  UDS: Neg,  Preg Urine: Neg,  TSH: 1.753,  Lipid Panel: WNL except LDL: 110,  A1c: 5.7,  Prolactin: 15.7,  EtOH: Neg,  EKG: Sinus Bradycardia with Qtc: 438. Labs (2/26): Ferritin: 5,  Iron and TIBC: WNL except  Iron:20,  Sat Ratio: 5,  Vit B12:546,  Vit D: 9.88 Labs (2/27):BMP: WNL except K:2.7,  CBC: WNL except MCH: 25.8,  Mag: 1.8,  UA: Spec Grav: 1.032,  Ket: 5,  Prot: 30,  Bacteria: rare Labs (3/2): BMP: WNL EKG(3/2): NSR with Qtc:436   12-08-1988, DO 12/30/2021, 5:21 PM

## 2021-12-30 NOTE — Group Note (Signed)
[facility] LCSW Group Therapy Note ? ?Date/Time: 1pm on 12/30/2021 ? ?Type of Therapy and Topic:  Group Therapy:  Who Am I?  Self Esteem, Self-Actualization and Understanding Self. ? ?Participation Level:  Active ? ?Description of Group:   ? In this group patients will be asked to explore values, beliefs, truths, and morals as they relate to personal self.  Patients will be guided to discuss their thoughts, feelings, and behaviors related to what they identify as important to their true self. Patients will process together how values, beliefs and truths are connected to specific choices patients make every day. Each patient will be challenged to identify changes that they are motivated to make in order to improve self-esteem and self-actualization. This group will be process-oriented, with patients participating in exploration of their own experiences as well as giving and receiving support and challenge from other group members. ? ?Therapeutic Goals: ?Patient will identify false beliefs that currently interfere with their self-esteem.  ?Patient will identify feelings, thought process, and behaviors related to self and will become aware of the uniqueness of themselves and of others.  ?Patient will be able to identify and verbalize values, morals, and beliefs as they relate to self. ?Patient will begin to learn how to build self-esteem/self-awareness by expressing what is important and unique to them personally. ? ?Summary of Patient Progress: Patient participated appropriately in group.  Patient reported that she thinks herself is important in her life.  Patient participated in discussion about values and shared with group members about topic.  ? ? ? ? ? ? ? ?Therapeutic Modalities:   ?Cognitive Behavioral Therapy ?Solution Focused Therapy ?Motivational Interviewing ?Brief Therapy ? ? ?Destony Prevost, LCSW, LCAS ?Clincal Social Worker  ?[facility] ? ? ?

## 2021-12-30 NOTE — Group Note (Signed)
Recreation Therapy Group Note ? ? ?Group Topic:Stress Management  ?Group Date: 12/30/2021 ?Start Time: 72 ?End Time: 3300 ?Facilitators: Caroll Rancher, LRT,CTRS ?Location: [address] Dayroom ? ? ?Goal Area(s) Addresses:  ?Patient will identify positive stress management techniques. ?Patient will identify benefits of using stress management post d/c. ? ?Group Description:  Meditation.  LRT played a mountain meditation which patients were encouraged to visualize a mountain and imagine taking on its qualities.  The meditation spoke of the mountain being able to stand through anything its faced with and still keep the qualities it has.  Patients were to listen and follow along as meditation played to fully engage in activity. ? ? ?Affect/Mood: Appropriate ?  ?Participation Level: Active ?  ?Participation Quality: Independent ?  ?Behavior: Attentive  ?  ?Speech/Thought Process: Focused ?  ?Insight: Good ?  ?Judgement: Good ?  ?Modes of Intervention: Meditation ?  ?Patient Response to Interventions:  Attentive ?  ?Education Outcome: ? Acknowledges education and In group clarification offered   ? ?Clinical Observations/Individualized Feedback: Pt attended and participated in group session.  ? ? ?Plan: Continue to engage patient in RT group sessions 2-3x/week. ? ? ?Caroll Rancher, LRT,CTRS ?12/30/2021 12:20 PM ?

## 2021-12-30 NOTE — Group Note (Unsigned)
Date:  12/30/2021 ?Time:  10:02 AM ? ?Group Topic/Focus:  ?Orientation:   The focus of this group is to educate the patient on the purpose and policies of crisis stabilization and provide a format to answer questions about their admission.  The group details unit policies and expectations of patients while admitted. ? ? ? ? ?Participation Level:  {BHH PARTICIPATION LEVEL:22264} ? ?Participation Quality:  {BHH PARTICIPATION QUALITY:22265} ? ?Affect:  {BHH AFFECT:22266} ? ?Cognitive:  {BHH COGNITIVE:22267} ? ?Insight: {BHH Insight2:20797} ? ?Engagement in Group:  {BHH ENGAGEMENT IN GROUP:22268} ? ?Modes of Intervention:  {BHH MODES OF INTERVENTION:22269} ? ?Additional Comments:  *** ? ?Dayshia Ballinas D Halie Gass ?12/30/2021, 10:02 AM ? ?

## 2021-12-30 NOTE — Group Note (Signed)
Date:  12/30/2021 ?Time:  10:17 AM ? ?Group Topic/Focus:  ?Orientation:   The focus of this group is to educate the patient on the purpose and policies of crisis stabilization and provide a format to answer questions about their admission.  The group details unit policies and expectations of patients while admitted. ? ? ? ?Participation Level:  Minimal ? ?Participation Quality:  Attentive ? ?Affect:  Appropriate ? ?Cognitive:  Appropriate ? ?Insight: Appropriate ? ?Engagement in Group:  Improving ? ?Modes of Intervention:  Discussion ? ?Additional Comments:  Engaged but limited in feedback.  ? ?Tammy Rosales ?12/30/2021, 10:17 AM ? ?

## 2021-12-30 NOTE — Progress Notes (Signed)
?   12/30/21 1200  ?Psych Admission Type (Psych Patients Only)  ?Admission Status Voluntary  ?Psychosocial Assessment  ?Patient Complaints None  ?Eye Contact Avoids  ?Facial Expression Flat  ?Affect Blunted  ?Speech Logical/coherent  ?Interaction Avoidant  ?Motor Activity Other (Comment)  ?Appearance/Hygiene Unremarkable  ?Behavior Characteristics Cooperative  ?Mood Sad  ?Thought Process  ?Coherency WDL  ?Content WDL  ?Delusions None reported or observed  ?Perception WDL  ?Hallucination None reported or observed  ?Judgment Impaired  ?Confusion None  ?Danger to Self  ?Current suicidal ideation? Denies  ?Danger to Others  ?Danger to Others None reported or observed  ? ? ?

## 2021-12-30 NOTE — Progress Notes (Signed)
?   12/29/21 2045  ?Psych Admission Type (Psych Patients Only)  ?Admission Status Voluntary  ?Psychosocial Assessment  ?Patient Complaints None  ?Eye Contact Avoids  ?Facial Expression Flat  ?Affect Blunted  ?Speech Logical/coherent  ?Interaction Avoidant  ?Motor Activity Other (Comment) ?(wnl)  ?Appearance/Hygiene Unremarkable  ?Behavior Characteristics Cooperative  ?Mood Sad  ?Thought Process  ?Coherency WDL  ?Content WDL  ?Delusions None reported or observed  ?Perception WDL  ?Hallucination None reported or observed  ?Judgment UTA  ?Confusion None  ?Danger to Self  ?Current suicidal ideation? Denies  ?Danger to Others  ?Danger to Others None reported or observed  ? ?Patient laying in bed. Pt denies SI, HI, AVH and pain. Pt denies anxiety and depression. Pt is flat with blunted affect. Minimal interaction. ?

## 2021-12-31 NOTE — B[facility] Group Notes (Signed)
Psychoeducational Group Note ? ? ? ?Date:12/31/21 ?Time: 1300-1400 ? ? ? ?Purpose of Group: . The group focus' on teaching patients on how to identify their needs and their Life Skills:  A group where two lists are made. What people need and what are things that we do that are unhealthy. The lists are developed by the patients and it is explained that we often do the actions that are not healthy to get our list of needs met. ? ?Goal:: to develop the coping skills needed to get their needs met ? ?Participation Level:  did not attend ?Tammy Rosales A ? ?

## 2021-12-31 NOTE — B[facility] Group Notes (Signed)
Goals Group ?3/4//2023 ? ? ?Group Focus: affirmation, clarity of thought, and goals/reality orientation ?Treatment Modality:  Psychoeducation ?Interventions utilized were assignment, group exercise, and support ?Purpose: To be able to understand and verbalize the reason for their admission to the hospital. To understand that the medication helps with their chemical imbalance but they also need to work on their choices in life. To be challenged to develop Rosales list of 30 positives about themselves. Also introduce the concept that "feelings" are not reality. ? ?Participation Level:  Active ? ?Participation Quality:  Appropriate ? ?Affect:  Appropriate ? ?Cognitive:  Appropriate ? ?Insight:  Improving ? ?Engagement in Group:  Engaged ? ?Additional Comments:  Rates her energy at Rosales 8/10. Participated fully. ? ?Tammy Rosales ?

## 2021-12-31 NOTE — B[facility] Group Notes (Signed)
Pt attended and participated in orientation/goals group ?

## 2021-12-31 NOTE — Group Note (Signed)
LCSW Group Therapy Note ? ?12/31/2021   10:30-11:30am  ? ?Type of Therapy and Topic:  Group Therapy: Anger Cues and Responses ? ?Participation Level:  Active ? ? ?Description of Group:   ?In this group, patients learned how to recognize the physical, cognitive, emotional, and behavioral responses they have to anger-provoking situations.  They identified a recent time they became angry and how they reacted.  They analyzed how their reaction was possibly beneficial and how it was possibly unhelpful.  The group discussed a variety of healthier coping skills that could help with such a situation in the future.  Focus was placed on how helpful it is to recognize the underlying emotions to our anger, because working on those can lead to a more permanent solution as well as our ability to focus on the important rather than the urgent. ? ?Therapeutic Goals: ?Patients will remember their last incident of anger and how they felt emotionally and physically, what their thoughts were at the time, and how they behaved. ?Patients will identify how their behavior at that time worked for them, as well as how it worked against them. ?Patients will explore possible new behaviors to use in future anger situations. ?Patients will learn that anger itself is normal and cannot be eliminated, and that healthier reactions can assist with resolving conflict rather than worsening situations. ? ?Summary of Patient Progress:  The patient shared that her most recent time of anger was when her new born would make a mess and said her way of dealing with her anger is to use deep breathing techniques. The group explored possible new behaviors to use in future anger situations. ? ?Therapeutic Modalities:   ?Cognitive Behavioral Therapy ? ?Veva Holes, LCSWA ?12/31/2021  1:29 PM   ? ?

## 2021-12-31 NOTE — Progress Notes (Signed)
?  [facility] Adult Case Management Discharge Plan : ? ?Will you be returning to the same living situation after discharge:  Yes,  with family ?At discharge, do you have transportation home?: Yes,  mother ?Do you have the ability to pay for your medications: Yes,  has insurance ? ?Release of information consent forms completed and emailed to Medical Records, then turned in to Medical Records by CSW.  ? ?Patient to Follow up at: ? Follow-up Information   ? ? Plc, [facility]. Go on 01/05/2022.   ?Why: You have an appointment scheduled for behavioral health therapy/counseling services on 01/05/22 at 11:00 am.  This appointment will be held in person. ?Contact information: ?[place], [place] ?[place] [address] ?[phone] ? ? ?  ?  ? ? [facility]. Go on 01/24/2022.   ?Why: You have an appointment for medication management services on 01/24/22 at 3:00 pm.  This appointment will be held in person. ?Contact information: ?[address] ?[place] [address] ?[phone] ? ? ?  ?  ? ? BEHAVIORAL HEALTH PARTIAL HOSPITALIZATION PROGRAM Follow up on 01/03/2022.   ?Specialty: Behavioral Health ?Why: You have an assessment appointment on 01/03/22 at 10:00 am.  This appointment will last approximately one hour and will be Virtual via Webex.  PHP is Virtual group therapy that runs Mon-Fri from 9 am - 1 pm.  Please download the [employer] app prior to the appt.  For questions, call [phone]. ?Contact information: ?[place] ?[place] ?[phone] ? ?  ?  ? ?  ?  ? ?  ? ? ?Next level of care provider has access to [facility] ? ?Safety Planning and Suicide Prevention discussed: Yes,  with mother ? ?  ? ?Has patient been referred to the Quitline?: N/A patient is not a smoker ? ?Patient has been referred for addiction treatment: N/A ? ?Maretta Los, LCSW ?12/31/2021, 10:20 AM ?

## 2021-12-31 NOTE — Progress Notes (Signed)
D. Pt presented with improved mood- smiled upon initial approach. Pt rated her depression, hopelessness and anxiety all 0's today. Pt has been visible in the dayroom attending groups this am. Pt currently denies SI/HI and AVH  ?A. Labs and vitals monitored. Pt given and educated on medications. Pt supported emotionally and encouraged to express concerns and ask questions.   ?R. Pt remains safe with 15 minute checks. Will continue POC. ? ?  ?

## 2021-12-31 NOTE — [facility] Suicide Risk Assessment (Addendum)
01/01/2022 - Discharge was cancelled. ?Ambrose Mantle, LCSW ?01/01/2022, 8:38 AM  ? ?Suicide Risk Assessment ? ?Discharge Assessment    ?[facility] Discharge Suicide Risk Assessment ? ? ?Principal Problem: MDD (major depressive disorder), single episode, severe with psychotic features (HCC) ?Discharge Diagnoses: Principal Problem: ?  MDD (major depressive disorder), single episode, severe with psychotic features (HCC) ?Active Problems: ?  Vitamin D deficiency ? ? ?Total Time spent with patient: 20 minutes ?Tammy Rosales is a 21 yr old female who presented to [facility] on 2/23 with her grandmother for symptoms of worsening depression, she was admitted to [facility] on 2/24. During hospitalization patient was diagnosed with MDD with psychotic features. Patient was noted to do well on the following medications to treat symptoms of depression and behavior changes: Lexapro 10mg  daily and Geodon 40mg  2x/day with food. Patient was recommended at discharge to continue this regimen and follow- up outpatient. At discharge patient had been able to display at least 24h of appropriate and improved behavior as well as sleep. Patient was denying SI, HI, and AVH. Patient was able to endorse age appropriate coping mechanisms she has learned during hospitalization and was able to voice a plan to help decrease repeat hospitalization. However patient was also able to voice a plan should should start to feel down, depressed, and overwhelmed. Patient was able to identify adults in her life who could be a support system. Patient was eating and sleeping well and endorsed that her mood was "good" on day of discharge.  ? ?Patient was educated that regarding her medical concerns she should have her Cholesterol, Vit D, and A1c rechecked and evaluated with her outpatient primary care provider. Patient endorsed understanding. Patient was otherwise medically stable at discharge.  ? ?Musculoskeletal: ?Strength & Muscle Tone: within normal limits ?Gait &  Station: normal ?Patient leans: N/A ? ?Psychiatric Specialty Exam ? ?Presentation  ?General Appearance: Appropriate for Environment ? ?Eye Contact:Good ? ?Speech:Clear and Coherent ? ?Speech Volume:Normal ? ?Handedness:Right ? ? ?Mood and Affect  ?Mood:Euthymic ? ?Duration of Depression Symptoms: Greater than two weeks ? ?Affect:Appropriate; Congruent ? ? ?Thought Process  ?Thought Processes:Coherent ? ?Descriptions of Associations:Circumstantial ? ?Orientation:Full (Time, Place and Person) ? ?Thought Content:Logical ? ?History of Schizophrenia/Schizoaffective disorder:No ? ?Duration of Psychotic Symptoms:No data recorded ?Hallucinations:Hallucinations: None ? ?Ideas of Reference:None ? ?Suicidal Thoughts:Suicidal Thoughts: No ? ?Homicidal Thoughts:Homicidal Thoughts: No ? ? ?Sensorium  ?Memory:Immediate Good; Recent Good ? ?Judgment:Fair ? ?Insight:Fair ? ? ?Executive Functions  ?Concentration:Good ? ?Attention Span:Good ? ?Recall:Fair ? ?Fund of Knowledge:Good ? ?Language:Good ? ? ?Psychomotor Activity  ?Psychomotor Activity:Psychomotor Activity: Normal ? ? ?Assets  ?Assets:Communication Skills; Resilience; Social Support; Desire for Improvement; Housing ? ? ?Sleep  ?Sleep:Sleep: Good ?Number of Hours of Sleep: 6.25 ? ? ?Physical Exam: ?Physical Exam ?Constitutional:   ?   Appearance: Normal appearance.  ?HENT:  ?   Head: Normocephalic and atraumatic.  ?Pulmonary:  ?   Effort: Pulmonary effort is normal.  ?Neurological:  ?   Mental Status: She is alert and oriented to person, place, and time.  ? ?Review of Systems  ?Cardiovascular:  Negative for chest pain.  ?Psychiatric/Behavioral:  Negative for depression, hallucinations and suicidal ideas. The patient is not nervous/anxious and does not have insomnia.   ?Blood pressure 98/68, pulse 84, temperature 98 ?F (36.7 ?C), temperature source Oral, resp. rate 18, height 5\' 4"  (1.626 m), weight 57.6 kg, SpO2 100 %, not currently breastfeeding. Body mass index is 21.8  kg/m?. ? ?Mental Status Per Nursing Assessment::   ?  On Admission:  NA ? ?Demographic Factors:  ?Adolescent or young adult ? ?Loss Factors: ?NA ? ?Historical Factors: ?NA ? ?Risk Reduction Factors:   ?Responsible for children under 56 years of age ? ?Continued Clinical Symptoms:  ?Denies ? ?Cognitive Features That Contribute To Risk:  ?None   ? ?Suicide Risk:  ?Minimal: No identifiable suicidal ideation.  Patients presenting with no risk factors but with morbid ruminations; may be classified as minimal risk based on the severity of the depressive symptoms ? ? Follow-up Information   ? ? [facility], [facility]. Go on 01/05/2022.   ?Why: You have an appointment scheduled for behavioral health therapy/counseling services on 01/05/22 at 11:00 am.  This appointment will be held in person. ?Contact information: ?[address], [place], [place] ?[place] [address] ?[phone] ? ? ?  ?  ? ? [facility] Go on 01/24/2022.   ?Why: You have an appointment for medication management services on 01/24/22 at 3:00 pm.  This appointment will be held in person. ?Contact information: ?[address] ?[address] ?[place] [address] ?[phone] ? ? ?  ?  ? ? BEHAVIORAL HEALTH PARTIAL HOSPITALIZATION PROGRAM Follow up on 01/03/2022.   ?Specialty: Behavioral Health ?Why: You have an assessment appointment on 01/03/22 at 10:00 am.  This appointment will last approximately one hour and will be Virtual via Webex.  PHP is Virtual group therapy that runs Mon-Fri from 9 am - 1 pm.  Please download the [employer] app prior to the appt.  For questions, call [phone]. ?Contact information: ?[address] ?[place] [address] ?[phone] ? ?  ?  ? ?  ?  ? ?  ? ? ?Plan Of Care/Follow-up recommendations:  ?Follow up recommendations: ?- Activity as tolerated. ?- Diet as recommended by PCP. ?- Keep all scheduled follow-up appointments as recommended.  ? ? ?PGY-2 ?Bobbye Morton, MD ?12/31/2021, 11:03  AM ?

## 2021-12-31 NOTE — Progress Notes (Signed)
[facility]  12/31/2021 11:23 AM Irving BurtonSha-Dria Rosales  MRN:  [id number] Subjective:  Tammy Rosales is a 21 yr old female who [facility] with her grandmother for symptoms of worsening depression, [facility]  Case was discussed in the multidisciplinary team. MAR was reviewed and patient was compliant with medications.  She did not require any PRN's for agitation.     Psychiatric Team made the following recommendations yesterday:  - Continue Lexapro 10 mg daily for depression -Continue Geodon 40 mg BID with food for psychosis  -Continue Ensure BID -Continue Multivitamin with iron -Continue Vitamin D 1000 IU daily - Will need PCP f/u after discharge  On assessment this AM patient is reports that her mood is "good" and that she is eating and sleeping well. Patient reports that she has learned to use deep breathing as a coping skill. Patient reports that she feels her grandmother could be a good adult support system and endorses that her grandmother has already recommended that patient try to create a schedule for she and the baby to help decrease overwhelmed sensation. Patient reports that her mother is going to offer her some support with her child. Patient reports that otherwise she is feeling more ready and able to care for her child. Patient is adamant that she never had thoughts of harming her child. Patient reports that she does recall having bizarre behavior on admission and that she feels her medication has been beneficial as she feels back to baseline and is no longer feeling down. Patient denies SI, HI, and AVH on assessment. Patient also denies symptoms of paranoia. Patient denies adverse side effects from medication.    Objectively patient is noted to have a bright affect. Patient is able to navigate unpredictable situations (discharging one day later that assumed).  Principal Problem: MDD (major depressive disorder), single episode, severe  with psychotic features (HCC) Diagnosis: Principal Problem:   MDD (major depressive disorder), single episode, severe with psychotic features (HCC) Active Problems:   Vitamin D deficiency  Total Time spent with patient: 20 minutes  Past Psychiatric History: See H&P  Past Medical History:  Past Medical History:  Diagnosis Date   History of preterm labor 06/06/2021   SVD 03/2021 at 33 weeks   Medical history non-contributory     Past Surgical History:  Procedure Laterality Date   DILATION AND CURETTAGE OF UTERUS     NO PAST SURGERIES     Family History:  Family History  Problem Relation Age of Onset   Hypertension Maternal Aunt    Family Psychiatric  History:  See H&P Social History:  Social History   Substance and Sexual Activity  Alcohol Use Not Currently     Social History   Substance and Sexual Activity  Drug Use Not Currently    Social History   Socioeconomic History   Marital status: Single    Spouse name: Not on file   Number of children: Not on file   Years of education: Not on file   Highest education level: Not on file  Occupational History   Not on file  Tobacco Use   Smoking status: Never   Smokeless tobacco: Never  Vaping Use   Vaping Use: Never used  Substance and Sexual Activity   Alcohol use: Not Currently   Drug use: Not Currently   Sexual activity: Not Currently  Other Topics Concern   Not on file  Social History Narrative  Not on file   Social Determinants of Health   Financial Resource Strain: Not on file  Food Insecurity: Food Insecurity Present   Worried About Running Out of Food in the Last Year: Sometimes true   Ran Out of Food in the Last Year: Sometimes true  Transportation Needs: Unmet Transportation Needs   Lack of Transportation (Medical): Yes   Lack of Transportation (Non-Medical): Yes  Physical Activity: Not on file  Stress: Not on file  Social Connections: Not on file   Additional Social History:                          Sleep: Good  Appetite:  Good  Current Medications: Current Facility-Administered Medications  Medication Dose Route Frequency Provider Last Rate Last Admin   acetaminophen (TYLENOL) tablet 650 mg  650 mg Oral Q6H PRN Lenard Lance, FNP       alum & mag hydroxide-simeth (MAALOX/MYLANTA) 200-200-20 MG/5ML suspension 30 mL  30 mL Oral Q4H PRN Lenard Lance, FNP       escitalopram (LEXAPRO) tablet 10 mg  10 mg Oral Daily Lenard Lance, FNP   10 mg at 12/31/21 2876   feeding supplement (ENSURE ENLIVE / ENSURE PLUS) liquid 237 mL  237 mL Oral BID BM Karsten Ro, MD   237 mL at 12/31/21 0809   hydrOXYzine (ATARAX) tablet 25 mg  25 mg Oral TID PRN Lenard Lance, FNP       OLANZapine zydis (ZYPREXA) disintegrating tablet 5 mg  5 mg Oral Q8H PRN Pashayan, Mardelle Matte, MD       And   LORazepam (ATIVAN) tablet 1 mg  1 mg Oral PRN Lauro Franklin, MD       And   ziprasidone (GEODON) injection 20 mg  20 mg Intramuscular PRN Lauro Franklin, MD       magnesium hydroxide (MILK OF MAGNESIA) suspension 30 mL  30 mL Oral Daily PRN Lenard Lance, FNP       multivitamins with iron tablet 1 tablet  1 tablet Oral Daily Karsten Ro, MD   1 tablet at 12/31/21 8115   traZODone (DESYREL) tablet 50 mg  50 mg Oral QHS PRN Lenard Lance, FNP       Vitamin D3 (Vitamin D) tablet 1,000 Units  1,000 Units Oral Daily Karsten Ro, MD   1,000 Units at 12/31/21 7262   ziprasidone (GEODON) capsule 40 mg  40 mg Oral BID WC Lauro Franklin, MD   40 mg at 12/31/21 0355    Lab Results: No results found for this or any previous visit (from the past 48 hour(s)).  Blood Alcohol level:  Lab Results  Component Value Date   ETH <10 12/22/2021    Metabolic Disorder Labs: Lab Results  Component Value Date   HGBA1C 5.7 (H) 12/22/2021   MPG 117 12/22/2021   Lab Results  Component Value Date   PROLACTIN 15.7 12/22/2021   Lab Results  Component Value Date   CHOL 177 12/22/2021   TRIG  43 12/22/2021   HDL 58 12/22/2021   CHOLHDL 3.1 12/22/2021   VLDL 9 12/22/2021   LDLCALC 110 (H) 12/22/2021    Physical Findings: AIMS: Facial and Oral Movements Muscles of Facial Expression: None, normal Lips and Perioral Area: None, normal Jaw: None, normal Tongue: None, normal,Extremity Movements Upper (arms, wrists, hands, fingers): None, normal Lower (legs, knees, ankles, toes): None, normal, Trunk Movements Neck, shoulders,  hips: None, normal, Overall Severity Severity of abnormal movements (highest score from questions above): None, normal Incapacitation due to abnormal movements: None, normal Patient's awareness of abnormal movements (rate only patient's report): No Awareness, Dental Status Current problems with teeth and/or dentures?: No Does patient usually wear dentures?: No  CIWA:    COWS:     Musculoskeletal: Strength & Muscle Tone: within normal limits Gait & Station: normal Patient leans: N/A  Psychiatric Specialty Exam:  Presentation  General Appearance: Appropriate for Environment  Eye Contact:Good  Speech:Clear and Coherent  Speech Volume:Normal  Handedness:Right   Mood and Affect  Mood:Euthymic  Affect:Appropriate; Congruent   Thought Process  Thought Processes:Coherent  Descriptions of Associations:Circumstantial  Orientation:Full (Time, Place and Person)  Thought Content:Logical  History of Schizophrenia/Schizoaffective disorder:No  Duration of Psychotic Symptoms:No data recorded Hallucinations:Hallucinations: None  Ideas of Reference:None  Suicidal Thoughts:Suicidal Thoughts: No  Homicidal Thoughts:Homicidal Thoughts: No   Sensorium  Memory:Immediate Good; Recent Good  Judgment:Fair  Insight:Fair   Executive Functions  Concentration:Good  Attention Span:Good  Recall:Fair  Fund of Knowledge:Good  Language:Good   Psychomotor Activity  Psychomotor Activity:Psychomotor Activity: Normal   Assets   Assets:Communication Skills; Resilience; Social Support; Desire for Improvement; Housing   Sleep  Sleep:Sleep: Good Number of Hours of Sleep: 6.25    Physical Exam: Physical Exam HENT:     Head: Normocephalic and atraumatic.  Eyes:     Extraocular Movements: Extraocular movements intact.  Pulmonary:     Effort: Pulmonary effort is normal.  Skin:    General: Skin is dry.  Neurological:     Mental Status: She is alert and oriented to person, place, and time.   Review of Systems  Psychiatric/Behavioral:  Negative for hallucinations and suicidal ideas. The patient does not have insomnia.   Blood pressure 98/68, pulse 84, temperature 98 F (36.7 C), temperature source Oral, resp. rate 18, height 5\' 4"  (1.626 m), weight 57.6 kg, SpO2 100 %, not currently breastfeeding. Body mass index is 21.8 kg/m.   Treatment Plan Summary: Daily contact with patient to assess and evaluate symptoms and progress in treatment and Medication management  Tammy Rosales is a 21 yr old female who presented to [facility] on 2/23 with her grandmother for symptoms of worsening depression with psychotic features on admission. She was admitted to [facility] on 2/24.    3/24 has responded well to her medications.  We will not make any medication changes at this time.  EKG obtained yesterday shows NSR with Qtc: 436.  We will plan for discharge tomorrow. Mom will be in town and reports that there is no way for patient to get home today, and does not want patient to be home alone. Mom endorses that patient has no way to get into the home without her. Mom reports that she was under the impression patient was to be discharged 3/4 and went to take care of business today.    MDD single episode severe with psychotic features - r/o with peripartum onset  -Continue Lexapro 10 mg daily for depression -Continue Geodon 40 mg BID with food for psychosis   Low Ferritin and low Fe+ Low Vitamin D -Continue Ensure  BID -Continue Multivitamin with iron -Continue Vitamin D 1000 IU daily - Will need PCP f/u after discharge     Hypokalemia (resolved) -K: 4.3     -Continue PRN's: Tylenol, Maalox, Atarax, Milk of Magnesia, Trazodone     Labs on Admission: CMP: WNL,  CBC: WNL except  Hem: 11.5,  HCT:35.7,  MCV:  79.2,  MCH: 25.5,  Mag: 1.9,  UDS: Neg,  Preg Urine: Neg,  TSH: 1.753,  Lipid Panel: WNL except LDL: 110,  A1c: 5.7,  Prolactin: 15.7,  EtOH: Neg,  EKG: Sinus Bradycardia with Qtc: 438. Labs (2/26): Ferritin: 5,  Iron and TIBC: WNL except  Iron:20,  Sat Ratio: 5,  Vit B12:546,  Vit D: 9.88 Labs (2/27):BMP: WNL except K:2.7,  CBC: WNL except MCH: 25.8,  Mag: 1.8,  UA: Spec Grav: 1.032,  Ket: 5,  Prot: 30,  Bacteria: rare Labs (3/2): BMP: WNL EKG(3/2): NSR with Qtc:436  PGY-2 Bobbye Morton, MD 12/31/2021, 11:23 AM

## 2022-01-01 DIAGNOSIS — F323 Major depressive disorder, single episode, severe with psychotic features: Secondary | ICD-10-CM

## 2022-01-01 MED ORDER — TAB-A-VITE/IRON PO TABS: 1.0000 | ORAL_TABLET | Freq: Every day | ORAL | 0 refills | Status: DC

## 2022-01-01 MED ORDER — VITAMIN D3 25 MCG PO TABS: 1000.0000 [IU] | ORAL_TABLET | Freq: Every day | ORAL | 0 refills | Status: DC

## 2022-01-01 MED ORDER — ESCITALOPRAM OXALATE 10 MG PO TABS: 10.0000 mg | ORAL_TABLET | Freq: Every day | ORAL | 0 refills | Status: DC

## 2022-01-01 MED ORDER — ZIPRASIDONE HCL 40 MG PO CAPS: 40.0000 mg | ORAL_CAPSULE | Freq: Two times a day (BID) | ORAL | 0 refills | Status: DC

## 2022-01-01 NOTE — Group Note (Signed)
?  [facility] LCSW Group Therapy Note ? ?Date/Time:  01/01/2022 10:00AM-11:00AM ? ?Type of Therapy and Topic:  Group Therapy:  Self-Care after Hospitalization ? ?Participation Level:  Active  ? ?Description of Group ?This process group involved patients discussing how they plan to take care of themselves in a better manner when they get home from the hospital.  The group started with patients listing one healthy and one unhealthy way they took care of themselves prior to hospitalization.  A discussion ensued about the differences in healthy and unhealthy coping skills.  Group members shared ideas about making changes when they return home so that they can stay well and in recovery.  The white board was used to list ideas so that patients can continue to see these ideas throughout the day. ? ?Therapeutic Goals ?Patient will identify and describe one healthy and one unhealthy coping technique used prior to hospitalization ?Patient will participate in generating ideas about healthy self-care options when they return to the community ?Patients will be supportive of one another and receive said support from others ?Patient will identify one healthy self-care activity to add to his/her post-hospitalization life that can help in recovery ? ?Summary of Patient Progress:  The patient expressed that prior to hospitalization some healthy self-care activity that she engaged in was doing her hair and nails, while unhealthy self-care activity included smoking.  Patient's participation in group was beneficial.   Patient identified resting as another self-care activity to add in her pursuit of recovery post-discharge. ? ? ?Therapeutic Modalities ?Brief Solution-Focused Therapy ?Motivational Interviewing ?Psychoeducation ? ? ?Read Drivers, LCSWA ?01/01/2022  5:16 PM   ? ?

## 2022-01-01 NOTE — Progress Notes (Signed)
D. Pt presented with a flat affect this am- denied SI/HI and A/VH, and rated her depression, hopelessness and anxiety all 0's this am. Pt was very brief upon initial approach, only giving brief answers, forwarding little information at med window.  A. Labs and vitals monitored. Pt given and educated on medications. Pt supported emotionally and encouraged to express concerns and ask questions.   ?R. Pt remains safe with 15 minute checks. Will continue POC. ? ?  ?

## 2022-01-01 NOTE — B[facility] Group Notes (Signed)
Adult Psychoeducational Group Not ?Date:  01/01/2022 ?Time:  [id number] ?Group Topic/Focus: PROGRESSIVE RELAXATION. A group where deep breathing is taught and tensing and relaxation muscle groups is used. Imagery is used as well.  Pts are asked to imagine 3 pillars that hold them up when they are not able to hold themselves up and to share that with the group. ? ?Participation Level:  Active ? ?Participation Quality:  Appropriate ? ?Affect:  Appropriate ? ?Cognitive:  Oriented ? ?Insight: Improving ? ?Engagement in Group:  Engaged ? ?Modes of Intervention:  Activity, Discussion, Education, and Support ? ?Additional Comments:  Pt rates her energy at a 10/10. States what holds her up is, me myself and I. ? ?Bryson Dames A ? ? ?

## 2022-01-01 NOTE — [facility] Suicide Risk Assessment (Signed)
Suicide Risk Assessment ? ?Discharge Assessment    ?[facility] Discharge Suicide Risk Assessment ? ? ?Principal Problem: MDD (major depressive disorder), single episode, severe with psychotic features (HCC) ?Discharge Diagnoses: Principal Problem: ?  MDD (major depressive disorder), single episode, severe with psychotic features (HCC) ?Active Problems: ?  Vitamin D deficiency ? ? ?Total Time spent with patient: 30 minutes ?Tammy Rosales is a 21 yr old female who presented to [facility] on 2/23 with her grandmother for symptoms of worsening depression, she was admitted to [facility] on 2/24.  Patient was noted to have improvement in mood, affect, and behavior and was able to practice coping skill. Patient participated well in group therapy activities on the unit. Patient was seen smiling and interacting with others and later was able to endorse better insight and judgement. Patient was able to endorse a plan to help decrease chance of rehospitalization including coping skills, and identifying her support system. Patient also endorsed knowledge of placed in the community that she could reach out to should she feel she is decompensating. Patient denied SI, HI, and AVH for at least 48h prior to discharge and maintained understanding of her medications endorsing that she had seen improvements with the medication intervention.  ?Musculoskeletal: ?Strength & Muscle Tone: within normal limits ?Gait & Station: normal ?Patient leans: N/A ? ?Psychiatric Specialty Exam ? ?Presentation  ?General Appearance: Appropriate for Environment ? ?Eye Contact:Good ? ?Speech:Clear and Coherent ? ?Speech Volume:Normal ? ?Handedness:Right ? ? ?Mood and Affect  ?Mood:Euthymic ? ?Duration of Depression Symptoms: Greater than two weeks ? ?Affect:Appropriate; Congruent ? ? ?Thought Process  ?Thought Processes:Coherent ? ?Descriptions of Associations:Circumstantial ? ?Orientation:Full (Time, Place and Person) ? ?Thought Content:Logical ? ?History of  Schizophrenia/Schizoaffective disorder:No ? ?Duration of Psychotic Symptoms:No data recorded ?Hallucinations:Hallucinations: None ? ?Ideas of Reference:None ? ?Suicidal Thoughts:Suicidal Thoughts: No ? ?Homicidal Thoughts:Homicidal Thoughts: No ? ? ?Sensorium  ?Memory:Immediate Good; Recent Good ? ?Judgment:Fair ? ?Insight:Fair ? ? ?Executive Functions  ?Concentration:Good ? ?Attention Span:Good ? ?Recall:Fair ? ?Fund of Knowledge:Good ? ?Language:Good ? ? ?Psychomotor Activity  ?Psychomotor Activity:Psychomotor Activity: Normal ? ? ?Assets  ?Assets:Communication Skills; Resilience; Social Support; Desire for Improvement; Housing ? ? ?Sleep  ?Sleep:Sleep: Good ? ? ?Physical Exam: ?Physical Exam ?HENT:  ?   Head: Normocephalic and atraumatic.  ?Pulmonary:  ?   Effort: Pulmonary effort is normal.  ?Neurological:  ?   Mental Status: She is alert and oriented to person, place, and time.  ? ?Review of Systems  ?Psychiatric/Behavioral:  Negative for hallucinations and suicidal ideas. The patient does not have insomnia.   ?Blood pressure 112/70, pulse 80, temperature 97.7 ?F (36.5 ?C), temperature source Oral, resp. rate 18, height 5\' 4"  (1.626 m), weight 57.6 kg, SpO2 100 %, not currently breastfeeding. Body mass index is 21.8 kg/m?. ? ?Mental Status Per Nursing Assessment::   ?On Admission:  NA ? ?Demographic Factors:  ?Adolescent or young adult ? ?Loss Factors: ?NA ? ?Historical Factors: ?NA ? ?Risk Reduction Factors:   ?Responsible for children under 29 years of age and Living with another person, especially a relative ? ?Continued Clinical Symptoms:  ?Deneis ? ?Cognitive Features That Contribute To Risk:  ?None   ? ?Suicide Risk:  ?Minimal: No identifiable suicidal ideation.  Patients presenting with no risk factors but with morbid ruminations; may be classified as minimal risk based on the severity of the depressive symptoms ? ? Follow-up Information   ? ? [facility]. Go on 01/05/2022.   ?Why: You  have  an appointment scheduled for behavioral health therapy/counseling services on 01/05/22 at 11:00 am.  This appointment will be held in person. ?Contact information: ?[address], [place], [place] ?[place] [address] ?[phone] ? ? ?  ?  ? ? [facility] Go on 01/24/2022.   ?Why: You have an appointment for medication management services on 01/24/22 at 3:00 pm.  This appointment will be held in person. ?Contact information: ?[address] ?[address] ?[place] [address] ?[phone] ? ? ?  ?  ? ? BEHAVIORAL HEALTH PARTIAL HOSPITALIZATION PROGRAM Follow up on 01/03/2022.   ?Specialty: Behavioral Health ?Why: You have an assessment appointment on 01/03/22 at 10:00 am.  This appointment will last approximately one hour and will be Virtual via Webex.  PHP is Virtual group therapy that runs Mon-Fri from 9 am - 1 pm.  Please download the [employer] app prior to the appt.  For questions, call [phone]. ?Contact information: ?[address] ?[place] [address] ?[phone] ? ?  ?  ? ?  ?  ? ?  ? ? ?Plan Of Care/Follow-up recommendations:  ?Follow up recommendations: ?- Activity as tolerated. ?- Diet as recommended by PCP. ?- Keep all scheduled follow-up appointments as recommended.  ? ?Patient is instructed to take all prescribed medications as recommended. Report any side effects or adverse reactions to your outpatient psychiatrist. Patient is instructed to abstain from alcohol and illegal drugs while on prescription medications. In the event of worsening symptoms, patient is instructed to call the crisis hotline, 911, 988, or go to [facility], or go to the nearest emergency department for evaluation and treatment.   ? ? ?PGY-2 ?Bobbye Morton, MD ?01/01/2022, 12:22 PM ?

## 2022-01-01 NOTE — Progress Notes (Signed)
Pt discharged to lobby. Pt was stable and appreciative at that time. All papers and prescriptions were given and valuables returned. Verbal understanding expressed. Denies SI/HI and A/VH. Pt given opportunity to express concerns and ask questions.  

## 2022-01-01 NOTE — Discharge Summary (Signed)
Physician Discharge Summary Note  Patient:  Tammy Rosales is an 21 y.o., female MRN:  [id number] DOB:  September 18, 2001 Patient phone:  [phone] (home)  Patient address:   [address] [place] [address],  Total Time spent with patient: 30 minutes  Date of Admission:  12/22/2021 Date of Discharge: 01/01/2022  Reason for Admission:  Depression w/ psychosis  Principal Problem: MDD (major depressive disorder), single episode, severe with psychotic features [facility]) Discharge Diagnoses: Principal Problem:   MDD (major depressive disorder), single episode, severe with psychotic features (HCC) Active Problems:   Vitamin D deficiency   Past Psychiatric History: Post-Partum Depression  Past Medical History:  Past Medical History:  Diagnosis Date   History of preterm labor 06/06/2021   SVD 03/2021 at 33 weeks   Medical history non-contributory     Past Surgical History:  Procedure Laterality Date   DILATION AND CURETTAGE OF UTERUS     NO PAST SURGERIES     Family History:  Family History  Problem Relation Age of Onset   Hypertension Maternal Aunt    Family Psychiatric  History: Reports no known Diagnosis', Substance Abuse, or Suicides Social History:  Social History   Substance and Sexual Activity  Alcohol Use Not Currently     Social History   Substance and Sexual Activity  Drug Use Not Currently    Social History   Socioeconomic History   Marital status: Single    Spouse name: Not on file   Number of children: Not on file   Years of education: Not on file   Highest education level: Not on file  Occupational History   Not on file  Tobacco Use   Smoking status: Never   Smokeless tobacco: Never  Vaping Use   Vaping Use: Never used  Substance and Sexual Activity   Alcohol use: Not Currently   Drug use: Not Currently   Sexual activity: Not Currently  Other Topics Concern   Not on file  Social History Narrative   Not on file   Social Determinants  of Health   Financial Resource Strain: Not on file  Food Insecurity: Food Insecurity Present   Worried About Running Out of Food in the Last Year: Sometimes true   Ran Out of Food in the Last Year: Sometimes true  Transportation Needs: Unmet Transportation Needs   Lack of Transportation (Medical): Yes   Lack of Transportation (Non-Medical): Yes  Physical Activity: Not on file  Stress: Not on file  Social Connections: Not on file    Hospital Course:  Tammy Rosales is a 21 yr old female who presented to [facility] on 2/23 with her grandmother for symptoms of worsening depression, she was admitted to [facility] on 2/24.     Given her flat affect with history of social withdrawal and responding to internal stimuli there was significant concern Severe Post-Partum Depression w/ Psychosis.  Due to this her Lexapro 10mg  was continued and patient was also started on Zyprexa.  Patient was also placed on the higher acuity hallway due to psychotic features (wearing clothes incorrectly, staring, responding to internal stimuli)  noted on initial assessment. Patient never required PRN medications for agitation and never had change in observation from q 15 min checks. However she continued to appear bizarre with flat affect and thus her Zyprexa was discontinued and patient was started on Geodon instead. Patient was noted to improve on Geodon and was titrated up to 40mg  BID. As patient's behavior improved she was downgraded  to lower acuity hallway where she was able to engage with the general patient population. Patient did well on this hall and in group therapy. Patient was noted to have improvement in her sleep, behavior, mood, insight and judgement. At time of discharge patient was able to endorse coping skills she had learned to handle her stressors and she and mom had come up with a plan to help patient get more rest while looking after her child. Patient was also able to endorse knowledge of resources in the  community should she feel she is decompensating, that she could reach out to for help. Patient endorsed understanding of her medication and intention to remain compliant.   By discharge patient had been consistently denying SI, HI, and AVH for at least 48 hrs. Patient was no longer displaying bizarre behavior and was not endorsing paranoid thoughts.   Medically, patient was noted to be anemic and have Vit D def. She was started on supplementation and instructed to f/u with PCP for abnormal labs.  Physical Findings: AIMS: Facial and Oral Movements Muscles of Facial Expression: None, normal Lips and Perioral Area: None, normal Jaw: None, normal Tongue: None, normal,Extremity Movements Upper (arms, wrists, hands, fingers): None, normal Lower (legs, knees, ankles, toes): None, normal, Trunk Movements Neck, shoulders, hips: None, normal, Overall Severity Severity of abnormal movements (highest score from questions above): None, normal Incapacitation due to abnormal movements: None, normal Patient's awareness of abnormal movements (rate only patient's report): No Awareness, Dental Status Current problems with teeth and/or dentures?: No Does patient usually wear dentures?: No  CIWA:    COWS:     Musculoskeletal: Strength & Muscle Tone: within normal limits Gait & Station: normal Patient leans: N/A   Psychiatric Specialty Exam:  Presentation  General Appearance: Appropriate for Environment  Eye Contact:Good  Speech:Clear and Coherent  Speech Volume:Normal  Handedness:Right   Mood and Affect  Mood:Euthymic  Affect:Appropriate; Congruent   Thought Process  Thought Processes:Coherent  Descriptions of Associations:Circumstantial  Orientation:Full (Time, Place and Person)  Thought Content:Logical  History of Schizophrenia/Schizoaffective disorder:No  Duration of Psychotic Symptoms:No data recorded Hallucinations:Hallucinations: None  Ideas of Reference:None  Suicidal  Thoughts:Suicidal Thoughts: No  Homicidal Thoughts:Homicidal Thoughts: No   Sensorium  Memory:Immediate Good; Recent Good  Judgment:Fair  Insight:Fair   Executive Functions  Concentration:Good  Attention Span:Good  Recall:Fair  Fund of Knowledge:Good  Language:Good   Psychomotor Activity  Psychomotor Activity:Psychomotor Activity: Normal   Assets  Assets:Communication Skills; Resilience; Social Support; Desire for Improvement; Housing   Sleep  Sleep:Sleep: Good    Physical Exam: Physical Exam Constitutional:      Appearance: Normal appearance.  HENT:     Head: Normocephalic and atraumatic.  Pulmonary:     Effort: Pulmonary effort is normal.  Skin:    General: Skin is dry.  Neurological:     Mental Status: She is alert and oriented to person, place, and time.   Review of Systems  Psychiatric/Behavioral:  Negative for hallucinations and suicidal ideas. The patient does not have insomnia.   Blood pressure 112/70, pulse 80, temperature 97.7 F (36.5 C), temperature source Oral, resp. rate 18, height 5\' 4"  (1.626 m), weight 57.6 kg, SpO2 100 %, not currently breastfeeding. Body mass index is 21.8 kg/m.   Social History   Tobacco Use  Smoking Status Never  Smokeless Tobacco Never   Tobacco Cessation:  N/A, patient does not currently use tobacco products   Blood Alcohol level:  Lab Results  Component Value Date  ETH <10 12/22/2021    Metabolic Disorder Labs:  Lab Results  Component Value Date   HGBA1C 5.7 (H) 12/22/2021   MPG 117 12/22/2021   Lab Results  Component Value Date   PROLACTIN 15.7 12/22/2021   Lab Results  Component Value Date   CHOL 177 12/22/2021   TRIG 43 12/22/2021   HDL 58 12/22/2021   CHOLHDL 3.1 12/22/2021   VLDL 9 12/22/2021   LDLCALC 110 (H) 12/22/2021    See Psychiatric Specialty Exam and Suicide Risk Assessment completed by Attending Physician prior to discharge.  Discharge destination:  Home  Is patient  on multiple antipsychotic therapies at discharge:  No   Has Patient had three or more failed trials of antipsychotic monotherapy by history:  No  Recommended Plan for Multiple Antipsychotic Therapies: NA   Allergies as of 01/01/2022   No Known Allergies      Medication List     TAKE these medications      Indication  escitalopram 10 MG tablet Commonly known as: LEXAPRO Take 1 tablet (10 mg total) by mouth daily. Start taking on: January 02, 2022  Indication: Major Depressive Disorder   multivitamins with iron Tabs tablet Take 1 tablet by mouth daily. Start taking on: January 02, 2022  Indication: Vitamin Deficiency   Vitamin D3 25 MCG tablet Commonly known as: Vitamin D Take 1 tablet (1,000 Units total) by mouth daily. Start taking on: January 02, 2022  Indication: Vitamin D Deficiency   ziprasidone 40 MG capsule Commonly known as: GEODON Take 1 capsule (40 mg total) by mouth 2 (two) times daily with a meal.  Indication: Psychosis        Follow-up Information     [facility], [facility]. Go on 01/05/2022.   Why: You have an appointment scheduled for behavioral health therapy/counseling services on 01/05/22 at 11:00 am.  This appointment will be held in person. Contact information: [address] [place], [place] [address] [phone]         [facility] Go on 01/24/2022.   Why: You have an appointment for medication management services on 01/24/22 at 3:00 pm.  This appointment will be held in person. Contact information: [address] [place] [address] [phone]         BEHAVIORAL HEALTH PARTIAL HOSPITALIZATION PROGRAM Follow up on 01/03/2022.   Specialty: Behavioral Health Why: You have an assessment appointment on 01/03/22 at 10:00 am.  This appointment will last approximately one hour and will be Virtual via Webex.  PHP is Virtual group therapy that runs Mon-Fri from 9 am - 1 pm.  Please download the [employer] app prior to the appt.  For questions, call [phone]. Contact information: [address] [place] [address] [phone]                Follow-up recommendations:  Follow up recommendations: - Activity as tolerated. - Diet as recommended by PCP. - Keep all scheduled follow-up appointments as recommended.   Comments:  Patient is instructed to take all prescribed medications as recommended. Report any side effects or adverse reactions to your outpatient psychiatrist. Patient is instructed to abstain from alcohol and illegal drugs while on prescription medications. In the event of worsening symptoms, patient is instructed to call the crisis hotline, 911, 988, or go to [facility], or go to the nearest emergency department for evaluation and treatment.    Signed:  PGY-2 Gerlean Ren  Loletta SpecterB Christoph Copelan, MD 01/01/2022, 4:31 PM

## 2022-01-01 NOTE — Progress Notes (Signed)
?   12/31/21 2300  ?Psych Admission Type (Psych Patients Only)  ?Admission Status Voluntary  ?Psychosocial Assessment  ?Patient Complaints None  ?Eye Contact Brief  ?Facial Expression Flat  ?Affect Appropriate to circumstance  ?Speech Logical/coherent  ?Interaction Forwards little  ?Motor Activity Slow  ?Appearance/Hygiene Improved  ?Behavior Characteristics Appropriate to situation;Cooperative  ?Mood Pleasant  ?Thought Process  ?Coherency WDL  ?Content WDL  ?Delusions None reported or observed  ?Perception WDL  ?Hallucination None reported or observed  ?Judgment Poor  ?Confusion None  ?Danger to Self  ?Current suicidal ideation? Denies  ?Danger to Others  ?Danger to Others None reported or observed  ? ? ?

## 2022-01-03 ENCOUNTER — Other Ambulatory Visit: Payer: Self-pay

## 2022-01-03 ENCOUNTER — Telehealth (HOSPITAL_COMMUNITY): Payer: Self-pay | Admitting: Licensed Clinical Social Worker

## 2022-01-03 ENCOUNTER — Other Ambulatory Visit (HOSPITAL_COMMUNITY): Payer: BC Managed Care – PPO | Attending: Psychiatry | Admitting: Licensed Clinical Social Worker

## 2022-01-03 DIAGNOSIS — F323 Major depressive disorder, single episode, severe with psychotic features: Secondary | ICD-10-CM

## 2022-01-03 NOTE — Plan of Care (Signed)
Pt agrees to tx plan °

## 2022-01-03 NOTE — Psych (Signed)
Virtual Visit via Video Note  I connected with Tammy Rosales Rosales on 01/03/22 at 10:00 AM EST by a video enabled telemedicine application and verified that I am speaking with the correct person using two identifiers.  Location: Patient: pt home Provider: clinical office   I discussed the limitations of evaluation and management by telemedicine and the availability of in person appointments. The patient expressed understanding and agreed to proceed.    Follow Up Instructions:    I discussed the assessment and treatment plan with the patient. The patient was provided an opportunity to ask questions and all were answered. The patient agreed with the plan and demonstrated an understanding of the instructions.   The patient was advised to call back or seek an in-person evaluation if the symptoms worsen or if the condition fails to improve as anticipated.  I provided 40 minutes of non-face-to-face time during this encounter.   Tammy Rosales Rosales, LCSWA  Comprehensive Clinical Assessment (CCA) Note  01/03/2022 Tammy Rosales Rosales [id number]  Chief Complaint:  Chief Complaint  Patient presents with   Depression   Visit Diagnosis: MDD with psychotic features    CCA Screening, Triage and Referral (STR)  Patient Reported Information How did you hear about [place]? Hospital Discharge  Referral name: [facility]  Referral phone number: No data recorded  Whom do you see for routine medical problems? I don't have a doctor  Practice/Facility Name: No data recorded Practice/Facility Phone Number: No data recorded Name of Contact: No data recorded Contact Number: No data recorded Contact Fax Number: No data recorded Prescriber Name: No data recorded Prescriber Address (if known): No data recorded  What Is the Reason for Your Visit/Call Today? Post-partum depression  How Long Has This Been Causing You Problems? > than 6 months  What Do You Feel Would Help You the Most Today? Treatment for Depression  or other mood problem   Have You Recently Been in Any Inpatient Treatment (Hospital/Detox/Crisis Center/28-Day Program)? Yes  Name/Location of Program/Hospital:[facility]  How Long Were You There? pt reports a week, chart review states 10 days  When Were You Discharged? 01/01/22   Have You Ever Received Services From [employer] Before? Yes  Who Do You See at [facility]? No data recorded  Have You Recently Had Any Thoughts About Hurting Yourself? No  Are You Planning to Commit Suicide/Harm Yourself At This time? No   Have you Recently Had Thoughts About [place]? No  Explanation: No data recorded  Have You Used Any Alcohol or Drugs in the Past 24 Hours? No  How Long Ago Did You Use Drugs or Alcohol? No data recorded What Did You Use and How Much? No data recorded  Do You Currently Have a Therapist/Psychiatrist? No  Name of Therapist/Psychiatrist: No data recorded  Have You Been Recently Discharged From Any Office Practice or Programs? No  Explanation of Discharge From Practice/Program: No data recorded    CCA Screening Triage Referral Assessment Type of Contact: Tele-Assessment  Is this Initial or Reassessment? Initial Assessment  Date Telepsych consult ordered in [facility]:  No data recorded Time Telepsych consult ordered in [facility]:  No data recorded  Patient Reported Information Reviewed? No data recorded Patient Left Without Being Seen? No data recorded Reason for Not Completing Assessment: No data recorded  Collateral Involvement: chart review   Does Patient Have a [place]? No data recorded Name and Contact of Legal Guardian: No data recorded If Minor and Not Living with Parent(s), Who has Custody?  No data recorded Is CPS involved or ever been involved? Never  Is APS involved or ever been involved? Never   Patient Determined To Be At Risk for Harm To Self or Others Based on Review of Patient Reported Information or Presenting  Complaint? No  Method: No data recorded Availability of Means: No data recorded Intent: No data recorded Notification Required: No data recorded Additional Information for Danger to Others Potential: No data recorded Additional Comments for Danger to Others Potential: No data recorded Are There Guns or Other Weapons in Your Home? No data recorded Types of Guns/Weapons: No data recorded Are These Weapons Safely Secured?                            No data recorded Who Could Verify You Are Able To Have These Secured: No data recorded Do You Have any Outstanding Charges, Pending Court Dates, Parole/Probation? No data recorded Contacted To Inform of Risk of Harm To Self or Others: No data recorded  Location of Assessment: Other (comment) ([facility] Outpt)   Does Patient Present under Involuntary Commitment? No  IVC Papers Initial File Date: No data recorded  [place] of Residence: [place]   Patient Currently Receiving the Following Services: Not Receiving Services   Determination of Need: Urgent (48 hours)   Options For Referral: Partial Hospitalization; Outpatient Therapy; Medication Management     CCA Biopsychosocial Intake/Chief Complaint:  Tammy Rosales Rosales is a 21yo female referred to [facility] after being hospitalized at [facility] for MDD with psychotic features. Cln notes there are inconsistencies in pts report vs chart review, such as with symptoms, weight changes, and decreased ADLs. Pt denies both during assessment but per chart review, her family reported decreased ADLs (particularly showering) and bizarre behaviors. She denied AVH but was noted to be appearing to respond to internal stimuli while at [facility]. She cites her stressors as, Claustrophobic. If theres a lot of people in the room, I dont like being in tight spaces. Mess. I like everything being organized. She states she is OCD, but when cln asked clarifying questions pt reported she has not been diagnosed with OCD. She denies previous  mental health treatment outside of recent hospitalization. She denies other hospitalizations, substance use, suicide attempts, NSSIB, previous diagnoses (except for post-partum depression), family history of mental health diagnoses, medical diagnoses, and the presence of firearms in the home. She states she last used substances two years ago while in college but denies abuse. She cites her supports as her mother, grandmother, and aunts. She lives with her mother, three siblings, and infant daughter. She states there are no firearms in the home.  Current Symptoms/Problems: Im really working with my patience with a lot of things. If I dont get something, get my way, I guess I would say I have a hot temper. I have OCD, I suffer from OCD a lot. When Genesis came, I would be like, oh, youre making a lot of mess. Pt not diagnosed with OCD, but reports traits.   Patient Reported Schizophrenia/Schizoaffective Diagnosis in Past: No   Strengths: willingness to engage in tx  Preferences: open to PHP  Abilities: able to engage in tx   Type of Services Patient Feels are Needed: "To get set up with a therapist."   Initial Clinical Notes/Concerns: recent psychosis and pt smiled inappropriately twice during assessment. Unclear if pt was responding to internal stimuli, as pt was on her cell.   Mental Health Symptoms Depression:  Irritability; Change in energy/activity; Difficulty Concentrating; Fatigue; Increase/decrease in appetite; Tearfulness; Weight gain/loss (decrease)   Duration of Depressive symptoms:  Greater than two weeks   Mania:   None   Anxiety:    Fatigue; Difficulty concentrating; Irritability; Worrying   Psychosis:   Affective flattening/alogia/avolition; Other negative symptoms (affective flattening)   Duration of Psychotic symptoms:  Less than six months   Trauma:   Avoids reminders of event; Detachment from others; Irritability/anger (pt reports trauma hx but did  not elaborate)   Obsessions:   Intrusive/time consuming; Attempts to suppress/neutralize; Recurrent & persistent thoughts/impulses/images   Compulsions:   None   Inattention:   None   Hyperactivity/Impulsivity:   None   Oppositional/Defiant Behaviors:   None   Emotional Irregularity:   Intense/inappropriate anger; Mood lability   Other Mood/Personality Symptoms:   Depressed/Irritable    Mental Status Exam Appearance and self-care  Stature:   Small   Weight:   Thin   Clothing:   Casual   Grooming:   Normal   Cosmetic use:   Age appropriate   Posture/gait:   Normal   Motor activity:   Not Remarkable   Sensorium  Attention:   Normal   Concentration:   Normal   Orientation:   X5   Recall/memory:   Normal   Affect and Mood  Affect:   Flat   Mood:   Depressed; Irritable   Relating  Eye contact:   Avoided (appeared to avoid, but difficult to assess due to camera placement)   Facial expression:   Depressed   Attitude toward examiner:   Guarded; Cooperative   Thought and Language  Speech flow:  Clear and Coherent   Thought content:   Appropriate to Mood and Circumstances   Preoccupation:   None   Hallucinations:   None   Organization:  No data recorded  [employer] of Knowledge:   Average   Intelligence:   Average   Abstraction:   Normal   Judgement:   Impaired   Reality Testing:   Adequate   Insight:   Poor   Decision Making:   Normal   Social Functioning  Social Maturity:   Isolates   Social Judgement:   Normal   Stress  Stressors:   Illness   Coping Ability:   Programme researcher, broadcasting/film/video Deficits:   Decision making; Self-care; Self-control   Supports:   Family     Religion: Religion/Spirituality Are You A Religious Person?: Yes What is Your Religious Affiliation?: Christian How Might This Affect Treatment?: "In a positive way, I would say."  Leisure/Recreation: Leisure /  Recreation Do You Have Hobbies?: Yes Leisure and Hobbies: soccer, running, music  Exercise/Diet: Exercise/Diet Do You Exercise?: Yes What Type of Exercise Do You Do?: Run/Walk How Many Times a Week Do You Exercise?: Daily Have You Gained or Lost A Significant Amount of Weight in the Past Six Months?: No Do You Follow a Special Diet?: No Do You Have Any Trouble Sleeping?: No   CCA Employment/Education Employment/Work Situation: Employment / Work Situation Employment Situation: Unemployed Patient's Job has Been Impacted by Current Illness: No What is the Longest Time Patient has Held a Job?: 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 Has Patient ever Been in the [employer]?: No  Education: Education Is Patient Currently Attending School?: No Did Teacher, adult education From [employer]?: Yes Did Physicist, medical?: Yes What Type of College Degree Do you Have?: did not graduate Did  [place]?: No Did You Have An Individualized Education Program (IIEP): No Did You Have Any Difficulty At School?: No Patient's Education Has Been Impacted by Current Illness:  (n/a)   CCA Family/Childhood History Family and Relationship History: Family history Marital status: Single Are you sexually active?: No What is your sexual orientation?: Heterosexual Has your sexual activity been affected by drugs, alcohol, medication, or emotional stress?: n/a Does patient have children?: Yes How many children?: 1 How is patient's relationship with their children?: "It's fine but it is difficult"  Childhood History:  Childhood History By whom was/is the patient raised?: Mother, Grandparents Additional childhood history information: Pt reports having no relationship with her father Description of patient's relationship with caregiver when they were a child: "I love my mom and my grandparents, so great." Patient's description of current relationship with people who raised  him/her: Great How were you disciplined when you got in trouble as a child/adolescent?: Spankings Does patient have siblings?: Yes Number of Siblings: 3 Description of patient's current relationship with siblings: "I love my siblings, so great." Did patient suffer any verbal/emotional/physical/sexual abuse as a child?: No Did patient suffer from severe childhood neglect?: No Has patient ever been sexually abused/assaulted/raped as an adolescent or adult?: No Was the patient ever a victim of a crime or a disaster?: No Witnessed domestic violence?: No Has patient been affected by domestic violence as an adult?: No  Child/Adolescent Assessment:     CCA Substance Use Alcohol/Drug Use: Alcohol / Drug Use History of alcohol / drug use?: No history of alcohol / drug abuse                         ASAM's:  Six Dimensions of Multidimensional Assessment  Dimension 1:  Acute Intoxication and/or Withdrawal Potential:      Dimension 2:  Biomedical Conditions and Complications:      Dimension 3:  Emotional, Behavioral, or Cognitive Conditions and Complications:     Dimension 4:  Readiness to Change:     Dimension 5:  Relapse, Continued use, or Continued Problem Potential:     Dimension 6:  Recovery/Living Environment:     ASAM Severity Score:    ASAM Recommended Level of Treatment:     Substance use Disorder (SUD)    Recommendations for Services/Supports/Treatments:    DSM5 Diagnoses: Patient Active Problem List   Diagnosis Date Noted   MDD (major depressive disorder), single episode, severe with psychotic features ([place]) 12/27/2021   Vitamin D deficiency 12/26/2021   Adjustment disorder with mixed anxiety and depressed mood 12/22/2021   Anxiety and depression 12/07/2021   Birth control counseling 12/07/2021    Patient Centered Plan: Patient is on the following Treatment Plan(s):  n/a   Referrals to Alternative Service(s): Referred to Alternative Service(s):    Place:   Date:   Time:    Referred to Alternative Service(s):   Place:   Date:   Time:    Referred to Alternative Service(s):   Place:   Date:   Time:    Referred to Alternative Service(s):   Place:   Date:   Time:      Collaboration of Care: Other provider involved in patient's care AEB chart review of [place] notes  Patient/Guardian was advised Release of Information must be obtained prior to any record release in order to collaborate their care with an outside provider. Patient/Guardian was advised if they have not already done so to contact the  registration department to sign all necessary forms in order for [place] to release information regarding their care.   Consent: Patient/Guardian gives verbal consent for treatment and assignment of benefits for services provided during this visit. Patient/Guardian expressed understanding and agreed to proceed.   Tammy Rosales Rosales, LCSWA

## 2022-01-04 ENCOUNTER — Ambulatory Visit (HOSPITAL_COMMUNITY): Payer: BC Managed Care – PPO

## 2022-01-04 ENCOUNTER — Other Ambulatory Visit (HOSPITAL_COMMUNITY): Payer: BC Managed Care – PPO

## 2022-01-04 ENCOUNTER — Telehealth (HOSPITAL_COMMUNITY): Payer: Self-pay | Admitting: Licensed Clinical Social Worker

## 2022-01-04 NOTE — [facility] Group Notes (Signed)
Spiritual care group on grief and loss facilitated by chaplain Dyanne Carrel, [facility]   Group Goal:   Support / Education around grief and loss   Members engage in facilitated group support and psycho-social education.   Group Description:   Following introductions and group rules, group members engaged in facilitated group dialog and support around topic of loss, with particular support around experiences of loss in their lives. Group Identified types of loss (relationships / self / things) and identified patterns, circumstances, and changes that precipitate losses. Reflected on thoughts / feelings around loss, normalized grief responses, and recognized variety in grief experience. Group noted Worden's four tasks of grief in discussion.   Group drew on Adlerian / Rogerian, narrative, MI,   Patient Progress: Tammy Rosales attended group and was an active participant in the conversation.  [address] [facility] Pager, [phone]

## 2022-01-05 ENCOUNTER — Other Ambulatory Visit (HOSPITAL_COMMUNITY): Payer: BC Managed Care – PPO

## 2022-01-05 ENCOUNTER — Ambulatory Visit (HOSPITAL_COMMUNITY): Payer: BC Managed Care – PPO

## 2022-01-05 DIAGNOSIS — F323 Major depressive disorder, single episode, severe with psychotic features: Secondary | ICD-10-CM | POA: Diagnosis not present

## 2022-01-06 ENCOUNTER — Other Ambulatory Visit (HOSPITAL_COMMUNITY): Payer: BC Managed Care – PPO

## 2022-01-06 ENCOUNTER — Ambulatory Visit (HOSPITAL_COMMUNITY): Payer: BC Managed Care – PPO

## 2022-01-09 ENCOUNTER — Ambulatory Visit (HOSPITAL_COMMUNITY): Payer: BC Managed Care – PPO

## 2022-01-09 ENCOUNTER — Other Ambulatory Visit (HOSPITAL_COMMUNITY): Payer: BC Managed Care – PPO

## 2022-01-09 NOTE — Telephone Encounter (Signed)
Cln was contacted by pt and her mother requesting information about 8 am appointment. Pt provided verbal consent for cln to speak freely with her mother present. Cln relayed that pt is scheduled for PHP, however cln had not received completed paperwork. Pt asked, "What paperwork?" And cln reminded pt that intake paperwork had been sent via email and that pt can be started in group as soon as completed paperwork is received. Pt's mother expressed concern regarding need for consent and stated, "She's not completely with it and she needs me to help her with this stuff." Cln relayed that pt can sign ROI to share medical records with her mother, but that we will not discuss pt's group progress with her mother. Pt stated, "Okay, I did the paperwork." Cln informed pt that this could not be true, as the paperwork must be printed and signed. Pt stated she and her mother were in the parking lot and cln requested pt come to the office to go over paperwork. Cln met with pt and discussed rules for group and pt stated she did not have any questions. ?

## 2022-01-10 ENCOUNTER — Other Ambulatory Visit: Payer: Self-pay

## 2022-01-10 ENCOUNTER — Other Ambulatory Visit (HOSPITAL_COMMUNITY): Payer: BC Managed Care – PPO

## 2022-01-10 ENCOUNTER — Other Ambulatory Visit (HOSPITAL_COMMUNITY): Payer: BC Managed Care – PPO | Admitting: Licensed Clinical Social Worker

## 2022-01-10 ENCOUNTER — Telehealth (HOSPITAL_COMMUNITY): Payer: Self-pay | Admitting: Licensed Clinical Social Worker

## 2022-01-10 DIAGNOSIS — F323 Major depressive disorder, single episode, severe with psychotic features: Secondary | ICD-10-CM

## 2022-01-10 NOTE — Telephone Encounter (Signed)
Cln was contacted by pt's grandfather requesting clarification on appointment times. Cln relayed that due to HIPAA, we cannot share specific details of pt information. Pt's grandfather stated pt was scheduled for conflicting appointments with [facility]. Cln informed him that [facility] is not affiliated with that agency and that we have no control over their scheduling. ?

## 2022-01-10 NOTE — Telephone Encounter (Signed)
Cln called to clarify PHP vs outpt and to discuss if PHP is a good fit. Pt signed ROI consenting for two-way communication with pt's mother, [name], which will be scanned into medical records. Cln relayed that pt seems to have difficulty comprehending what was discussed during CCA, as evidenced by pt denying she had other providers and joining group while in the car when cln discussed rules with pt and pt signed group rules. Pt's mother reiterated that pt is not completely "with it" and is trying to help her. Pt's mother indicated pt is unlikely to talk during group. Cln relayed that PHP is voluntary and that she does not have to complete the program. Cln emphasized the importance of keeping psychiatry appointments due to recent psychosis and reviewed SPE regarding medications that was attempted while pt was hospitalized. Cln relayed that [facility] recommends all meds be administered by another responsible adult in the home initially after d/c and that this is important in Tammy Rosales's case for medication compliance. [name] was agreeable. Cln recommended she talk to pt about group again to see if it's what she wants to do and that an invitation will be sent and pt can join group tomorrow if she would like to continue, but that we can discharge if pt does not join. Pt's mother verbalized understanding. ?

## 2022-01-11 ENCOUNTER — Telehealth (HOSPITAL_COMMUNITY): Payer: Self-pay | Admitting: Professional

## 2022-01-11 ENCOUNTER — Other Ambulatory Visit (HOSPITAL_COMMUNITY): Payer: BC Managed Care – PPO

## 2022-01-11 ENCOUNTER — Ambulatory Visit (HOSPITAL_COMMUNITY): Payer: BC Managed Care – PPO

## 2022-01-12 ENCOUNTER — Other Ambulatory Visit (HOSPITAL_COMMUNITY): Payer: BC Managed Care – PPO

## 2022-01-12 ENCOUNTER — Ambulatory Visit (HOSPITAL_COMMUNITY): Payer: BC Managed Care – PPO

## 2022-01-13 ENCOUNTER — Ambulatory Visit (HOSPITAL_COMMUNITY): Payer: BC Managed Care – PPO

## 2022-01-13 ENCOUNTER — Other Ambulatory Visit (HOSPITAL_COMMUNITY): Payer: BC Managed Care – PPO

## 2022-01-16 ENCOUNTER — Other Ambulatory Visit (HOSPITAL_COMMUNITY): Payer: BC Managed Care – PPO

## 2022-01-16 ENCOUNTER — Ambulatory Visit (HOSPITAL_COMMUNITY): Payer: BC Managed Care – PPO

## 2022-01-17 ENCOUNTER — Ambulatory Visit (HOSPITAL_COMMUNITY): Payer: BC Managed Care – PPO

## 2022-01-17 ENCOUNTER — Other Ambulatory Visit (HOSPITAL_COMMUNITY): Payer: BC Managed Care – PPO

## 2022-01-17 DIAGNOSIS — F323 Major depressive disorder, single episode, severe with psychotic features: Secondary | ICD-10-CM | POA: Diagnosis not present

## 2022-01-18 ENCOUNTER — Other Ambulatory Visit (HOSPITAL_COMMUNITY): Payer: BC Managed Care – PPO

## 2022-01-18 ENCOUNTER — Ambulatory Visit (HOSPITAL_COMMUNITY): Payer: BC Managed Care – PPO

## 2022-01-19 ENCOUNTER — Other Ambulatory Visit (HOSPITAL_COMMUNITY): Payer: BC Managed Care – PPO

## 2022-01-19 ENCOUNTER — Ambulatory Visit (HOSPITAL_COMMUNITY): Payer: BC Managed Care – PPO

## 2022-01-20 ENCOUNTER — Ambulatory Visit (HOSPITAL_COMMUNITY): Payer: BC Managed Care – PPO

## 2022-01-20 ENCOUNTER — Other Ambulatory Visit (HOSPITAL_COMMUNITY): Payer: BC Managed Care – PPO

## 2022-01-24 DIAGNOSIS — F3131 Bipolar disorder, current episode depressed, mild: Secondary | ICD-10-CM | POA: Diagnosis not present

## 2022-01-27 DIAGNOSIS — F323 Major depressive disorder, single episode, severe with psychotic features: Secondary | ICD-10-CM | POA: Diagnosis not present

## 2022-01-31 ENCOUNTER — Telehealth (HOSPITAL_COMMUNITY): Payer: Self-pay | Admitting: Licensed Clinical Social Worker

## 2022-01-31 NOTE — Telephone Encounter (Signed)
Cln received phone call from Carrolyn Meiers, who identified himself as pt's grandfather. Cln requested verbal consent from pt to talk to grandparents and Mr. Ruiz put pt on the line. Timoteo Expose provided verbal consent for cln to speak to her grandparents, Olin Hauser and Gitzel Gowans. Richard inquired why pt has not been receiving invitations to join group. Cln informed him pt was discharged. See 3/14 documentation for additional details. Richard asked if pt could re-join group and cln explained that due to pt's psychosis and inability to pursue her own treatment, as evidenced by pt's grandparents contacting cln to discuss, pt is not appropriate for group. Cln relayed that PHP focuses on coping skills and based on interactions with pt, she would not be able to participate and follow group rules based on her being in the car with her mother during group even though pt was explicitly informed this was not allowed and pt signed paperwork stating she understood the rules and agreed to follow them. Cln asked pt if she remembered why she was discharged from group and pt responded, "Because ya'll scheduled me for two groups on the same day." Cln reiterated that her individual therapist is not part of [place], as was discussed on 3/14. Pt was invited to join group the following day and pt did not attend, so pt was discharged. Richard requested pt be able to start group again because she has been moved into his and her grandmother's home and that they will ensure she participates. Cln stated that others cannot be present while pt is in group and that PHP is most beneficial for pts that can engage on their own and navigate their treatment. Cln asked pt to explain how group will be beneficial for her and pt stated, "Help with my mental health." Cln asked pt to explain how PHP will accomplish this more so than individual therapy and pt paused before responding, "Talking to other people that have been through the same thing." Cln  reiterated that pt does not seem to be appropriate for group based on her lack of understanding and inability to coordinate her own treatment. Richard continued to advocate for pt's return to group and cln continued to explain that pt is not appropriate for group and should continue outpatient therapy and medication management. Cln explained that medication management is critical to pt's treatment due to psychosis. Cln stated that if time passes and pt is more oriented and independent, we can revisit PHP if she is struggling with coping. Richard verbalized understanding. Richard called back five minutes later and asked how to assess for improvement. He stated that sometimes she will stare off into space. Cln recommended he Olin Hauser discuss with pt's psychiatrist and therapist, as well as looking for signs that she is responding to internal stimuli (smiling and/or laughing at inappropriate times, specifically, as this was how pt presented). Richard verbalized understanding. ?

## 2022-02-03 DIAGNOSIS — F323 Major depressive disorder, single episode, severe with psychotic features: Secondary | ICD-10-CM | POA: Diagnosis not present

## 2022-02-07 DIAGNOSIS — F3131 Bipolar disorder, current episode depressed, mild: Secondary | ICD-10-CM | POA: Diagnosis not present

## 2022-02-14 DIAGNOSIS — F323 Major depressive disorder, single episode, severe with psychotic features: Secondary | ICD-10-CM | POA: Diagnosis not present

## 2022-02-22 DIAGNOSIS — F323 Major depressive disorder, single episode, severe with psychotic features: Secondary | ICD-10-CM | POA: Diagnosis not present

## 2022-03-01 NOTE — Psych (Signed)
Pt scheduled to begin PHP today. Pt did not present. Continued effort to make contact via telephone. See telephone notes.  ?

## 2022-04-17 ENCOUNTER — Ambulatory Visit (HOSPITAL_COMMUNITY)
Admission: EM | Admit: 2022-04-17 | Discharge: 2022-04-17 | Disposition: A | Discharge status: Home / Self Care | Payer: BC Managed Care – PPO | Attending: Psychiatry | Admitting: Psychiatry

## 2022-04-17 ENCOUNTER — Telehealth (HOSPITAL_COMMUNITY): Payer: Self-pay | Admitting: Licensed Clinical Social Worker

## 2022-04-17 DIAGNOSIS — Z046 Encounter for general psychiatric examination, requested by authority: Secondary | ICD-10-CM

## 2022-04-17 DIAGNOSIS — F32A Depression, unspecified: Secondary | ICD-10-CM

## 2022-04-17 MED ORDER — ESCITALOPRAM OXALATE 10 MG PO TABS: 10.0000 mg | ORAL_TABLET | Freq: Every day | ORAL | 0 refills | Status: AC

## 2022-04-17 MED ORDER — ZIPRASIDONE HCL 20 MG PO CAPS: 20.0000 mg | ORAL_CAPSULE | Freq: Two times a day (BID) | ORAL | 0 refills | Status: DC
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
## 2022-04-17 NOTE — Progress Notes (Signed)
Date: 04/17/22 Time: 5:15pm  SWK called [facility] CPS after hours phone number to start the CPS report.  [facility] CPS will call the SWK back to complete the Assessment.  Nancee Liter [facility]
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
## 2022-04-17 NOTE — Discharge Instructions (Addendum)
    [facility] [address] [place], [place], [address] [phone] office [email or web address]  [facility] have an abundance of resources to help support parents with children under the age of 5 such as  Parents as Teachers @ [phone] office 2.   Healthy Start @ [phone] office 3.   Regional Child Care Resource & Referral @ [phone] 4.   Early Head Start and Head Start @ [phone] 5.   Bright by Text is a free text messaging program, offering parenting tips - text BRIGHT to 274 448  There are other resources such as information to the [facility] office to receive [facility] assistance; resources for child-care vouchers, and resources for children with special needs.  [facility] ([phone] phone, ext. [phone]  [facility] [address] [place] [place], [place], [address]  [facility] [place] [email or web address]    [facility] [Shelter] [address] [place], [place] [address] [phone] Eligibility: Females Ages 11-21, pregnant or parenting with children www.youthfocus.org   [facility] [address] [place], [place] [address] [phone] Services: Child passenger safety seats, diapers, baby clothing, formula/baby food.  Requirements: A referral is needed from a nonprofit agency, [facility] Department of [employer], or the Department of [facility].  Corie Chiquito. [facility]) - Emergency Family Shelter [address] [place], [place], [place] [address] [phone] or [phone] Population served: Families with children.  [facility] - [phone] phone Coordinated Entry for Shelter, Rapid Rehousing programs, other help for unhoused.  Legal Aid Helpline [phone]       Please call the churches/agencies/organizations listed below to learn more about when and where hot meals  are provided.   [facility] [address], [place], [place] [address]  [facility] [address] [place], [place], [place] [address]  [facility] and [facility] ([address]) [address] [place], [place] [address] Dinner: Tuesdays & Thursdays 6:00PM - 6:30PM  [facility] [address], [place], [place]  [address] [phone]  [facility] [address] [place], [place] [address] [phone]  [facility]  [address], [place], [place] [address] [phone]   Crisis Lines  [facility] [address] [place], [place] [address] [phone] Hours: Mon-Fri. 8am-5pm  [facility] Assessment [address] [place], [place] [address] [phone] Open 24 Hours  [facility] Mobile Crisis Management Mobile crisis response for mental health, substance abuse or intellectual/developmental disabilities [phone]  [facility] Access services for mental health, substance abuse, and intellectual and developmental disabilities via the 24-Hour Call Center at ([phone].  [facility] Call our 24-hour HelpLine at 651-126-9966 or (667) [phone] for immediate assistance for mental health and substance abuse issues. Or walk into: [facility] [address] [place], [place] [address] or [facility] [address] [place], [place] [address] for a prompt in-person crisis assessment.  [employer] 1-800-SUICIDE  The National Suicide Prevention Lifeline> 1-800-273-TALK

## 2022-04-17 NOTE — Telephone Encounter (Signed)
Cln received phone call from pt's mother stating she is having another "episode" and is inquiring about readmission to [facility]. Cln recommended taking pt to [facility] for assessment and/or calling 911 for assistance, as pt would not meet admission criteria for PHP and is in an acute crisis, per her mother. Ms. Sharpless verbalized understanding.
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
## 2022-04-17 NOTE — ED Notes (Signed)
Pt discharged in no acute distress. Verbalized understanding of instructions reviewed on AVS. [employer] service transported pt to home. Safety maintained.

## 2022-05-01 DIAGNOSIS — F3131 Bipolar disorder, current episode depressed, mild: Secondary | ICD-10-CM | POA: Diagnosis not present

## 2022-05-05 DIAGNOSIS — F3131 Bipolar disorder, current episode depressed, mild: Secondary | ICD-10-CM | POA: Diagnosis not present

## 2022-05-19 ENCOUNTER — Encounter: Payer: BC Managed Care – PPO | Admitting: Nurse Practitioner

## 2022-06-01 ENCOUNTER — Ambulatory Visit (INDEPENDENT_AMBULATORY_CARE_PROVIDER_SITE_OTHER): Payer: BC Managed Care – PPO | Admitting: Nurse Practitioner

## 2022-06-01 ENCOUNTER — Encounter: Payer: Self-pay | Admitting: Nurse Practitioner

## 2022-06-01 VITALS — BP 110/60 | HR 86 | Temp 97.3°F | Ht 64.0 in | Wt 166.4 lb

## 2022-06-01 DIAGNOSIS — Z0000 Encounter for general adult medical examination without abnormal findings: Secondary | ICD-10-CM

## 2022-06-01 DIAGNOSIS — F323 Major depressive disorder, single episode, severe with psychotic features: Secondary | ICD-10-CM | POA: Diagnosis not present

## 2022-06-01 DIAGNOSIS — Z3009 Encounter for other general counseling and advice on contraception: Secondary | ICD-10-CM

## 2022-06-01 MED ORDER — NORETHIN ACE-ETH ESTRAD-FE 1-20 MG-MCG PO TABS: 1.0000 | ORAL_TABLET | Freq: Every day | ORAL | 11 refills | Status: DC

## 2022-06-01 NOTE — Patient Instructions (Signed)
It was great to see you!  Start birth control pill once a day. Try and take this at the same time every day.   Let's follow-up in 6 months, sooner if you have concerns.  If a referral was placed today, you will be contacted for an appointment. Please note that routine referrals can sometimes take up to 3-4 weeks to process. Please call our office if you haven't heard anything after this time frame.  Take care,  Rodman Pickle, NP

## 2022-06-01 NOTE — Progress Notes (Signed)
BP 110/60 (BP Location: Right Arm, Patient Position: Sitting, Cuff Size: Small)   Pulse 86   Temp (!) 97.3 F (36.3 C) (Temporal)   Ht 5\' 4"  (1.626 m)   Wt 166 lb 6.4 oz (75.5 kg)   SpO2 98%   BMI 28.56 kg/m    Subjective:    Patient ID: , female    DOB: [birth year], 21 y.o.   MRN: [id number]  HPI: Tammy Rosales is a 21 y.o. female presenting on 06/01/2022 for comprehensive medical examination. Current medical complaints include:none  She currently lives with: mom and daughter Menopausal Symptoms: no  Depression Screen done today and results listed below:     06/01/2022    4:21 PM 04/17/2022    4:33 PM 01/03/2022   10:35 AM 12/07/2021   10:10 AM 04/21/2021    2:25 PM  Depression screen PHQ 2/9  Decreased Interest 0   2 0  Down, Depressed, Hopeless 0   2 0  PHQ - 2 Score 0   4 0  Altered sleeping    2 2  Tired, decreased energy    2 0  Change in appetite    2 3  Feeling bad or failure about yourself     2 0  Trouble concentrating    2 0  Moving slowly or fidgety/restless    2 0  Suicidal thoughts    0 0  PHQ-9 Score    16 5  Difficult doing work/chores    Not difficult at all      Information is confidential and restricted. Go to Review Flowsheets to unlock data.    The patient does not have a history of falls. I did not complete a risk assessment for falls. A plan of care for falls was not documented.   Past Medical History:  Past Medical History:  Diagnosis Date   History of preterm labor 06/06/2021   SVD 03/2021 at 33 weeks   Medical history non-contributory     Surgical History:  Past Surgical History:  Procedure Laterality Date   DILATION AND CURETTAGE OF UTERUS     NO PAST SURGERIES      Medications:  Current Outpatient Medications on File Prior to Visit  Medication Sig   escitalopram (LEXAPRO) 10 MG tablet Take 1 tablet (10 mg total) by mouth daily. (Patient not taking: Reported on 06/01/2022)   ziprasidone (GEODON) 20 MG capsule Take 1  capsule (20 mg total) by mouth 2 (two) times daily with a meal. (Patient not taking: Reported on 06/01/2022)   No current facility-administered medications on file prior to visit.    Allergies:  No Known Allergies  Social History:  Social History   Socioeconomic History   Marital status: Single    Spouse name: Not on file   Number of children: Not on file   Years of education: Not on file   Highest education level: Not on file  Occupational History   Not on file  Tobacco Use   Smoking status: Never   Smokeless tobacco: Never  Vaping Use   Vaping Use: Never used  Substance and Sexual Activity   Alcohol use: Not Currently   Drug use: Not Currently   Sexual activity: Not Currently  Other Topics Concern   Not on file  Social History Narrative   Not on file   Social Determinants of Health   Financial Resource Strain: Not on file  Food Insecurity: Food Insecurity Present (04/12/2021)   Hunger Vital Sign  Worried About Programme researcher, broadcasting/film/video in the Last Year: Sometimes true    [employer] of Food in the Last Year: Sometimes true  Transportation Needs: Unmet Transportation Needs (04/12/2021)   PRAPARE - Administrator, Civil Service (Medical): Yes    Lack of Transportation (Non-Medical): Yes  Physical Activity: Not on file  Stress: Not on file  Social Connections: Not on file  Intimate Partner Violence: Not on file   Social History   Tobacco Use  Smoking Status Never  Smokeless Tobacco Never   Social History   Substance and Sexual Activity  Alcohol Use Not Currently    Family History:  Family History  Problem Relation Age of Onset   Hypertension Maternal Aunt     Past medical history, surgical history, medications, allergies, family history and social history reviewed with patient today and changes made to appropriate areas of the chart.   Review of Systems  Constitutional: Negative.   HENT: Negative.    Eyes: Negative.   Respiratory: Negative.     Cardiovascular: Negative.   Gastrointestinal: Negative.   Genitourinary: Negative.   Musculoskeletal: Negative.   Skin: Negative.   Neurological: Negative.   Psychiatric/Behavioral: Negative.     All other ROS negative except what is listed above and in the HPI.      Objective:    BP 110/60 (BP Location: Right Arm, Patient Position: Sitting, Cuff Size: Small)   Pulse 86   Temp (!) 97.3 F (36.3 C) (Temporal)   Ht 5\' 4"  (1.626 m)   Wt 166 lb 6.4 oz (75.5 kg)   SpO2 98%   BMI 28.56 kg/m   Wt Readings from Last 3 Encounters:  06/01/22 166 lb 6.4 oz (75.5 kg)  12/07/21 132 lb (59.9 kg)  06/06/21 137 lb 9.6 oz (62.4 kg)    Physical Exam Vitals and nursing note reviewed.  Constitutional:      General: She is not in acute distress.    Appearance: Normal appearance.  HENT:     Head: Normocephalic and atraumatic.     Right Ear: Tympanic membrane, ear canal and external ear normal.     Left Ear: Tympanic membrane, ear canal and external ear normal.  Eyes:     Conjunctiva/sclera: Conjunctivae normal.  Cardiovascular:     Rate and Rhythm: Normal rate and regular rhythm.     Pulses: Normal pulses.     Heart sounds: Normal heart sounds.  Pulmonary:     Effort: Pulmonary effort is normal.     Breath sounds: Normal breath sounds.  Abdominal:     Palpations: Abdomen is soft.     Tenderness: There is no abdominal tenderness.  Musculoskeletal:        General: Normal range of motion.     Cervical back: Normal range of motion and neck supple. No tenderness.     Right lower leg: No edema.     Left lower leg: No edema.  Lymphadenopathy:     Cervical: No cervical adenopathy.  Skin:    General: Skin is warm and dry.  Neurological:     General: No focal deficit present.     Mental Status: She is alert and oriented to person, place, and time.     Cranial Nerves: No cranial nerve deficit.     Coordination: Coordination normal.     Gait: Gait normal.  Psychiatric:        Mood and  Affect: Mood normal.  Behavior: Behavior normal.        Thought Content: Thought content normal.        Judgment: Judgment normal.     Results for orders placed or performed in visit on 12/14/21  Pathologist smear review  Result Value Ref Range   Path Review    CBC with Differential/Platelet  Result Value Ref Range   WBC 5.7 4.5 - 10.5 K/uL   RBC 4.72 3.87 - 5.11 Mil/uL   Hemoglobin 11.8 (L) 12.0 - 15.0 g/dL   HCT 24.5 80.9 - 98.3 %   MCV 78.9 78.0 - 100.0 fl   MCHC 31.7 30.0 - 36.0 g/dL   RDW 38.2 (H) 50.5 - 39.7 %   Platelets 245.0 150.0 - 400.0 K/uL   Neutrophils Relative % 32.0 (L) 43.0 - 77.0 %   Lymphocytes Relative 54.8 (H) 12.0 - 46.0 %   Monocytes Relative 9.8 3.0 - 12.0 %   Eosinophils Relative 2.7 0.0 - 5.0 %   Basophils Relative 0.7 0.0 - 3.0 %   Neutro Abs 1.8 1.4 - 7.7 K/uL   Lymphs Abs 3.1 0.7 - 4.0 K/uL   Monocytes Absolute 0.6 0.1 - 1.0 K/uL   Eosinophils Absolute 0.2 0.0 - 0.7 K/uL   Basophils Absolute 0.0 0.0 - 0.1 K/uL      Assessment & Plan:   Problem List Items Addressed This Visit       Other   Birth control counseling    Discussed contraception options and she would like to start the pill. Her menstrual period started today, she can start loestrin Fe today. Encouraged her to take this at the same time every day. Follow-up with any concerns.       MDD (major depressive disorder), single episode, severe with psychotic features (HCC)    Chronic, stable. She is currently following with psychiatry. She stopped the lexapro and Geodon, and is now getting an injectable medication, however she does not remember the name of it. Continue recommendations and collaboration with psychiatry.       Other Visit Diagnoses     Routine general medical examination at a health care facility    -  Primary   Health maintenance reviewed and updated. Declines pap and HPV today. UTD tetanus. discussed nutrition and exercise. F/U 1 year        Follow up  plan: Return in about 6 months (around 12/02/2022) for Depression.   LABORATORY TESTING:  - Pap smear:  declines  IMMUNIZATIONS:   - Tdap: Tetanus vaccination status reviewed: last tetanus booster within 10 years. - Influenza: Postponed to flu season - Pneumovax: Not applicable - Prevnar: Not applicable - HPV: Refused - Zostavax vaccine: Not applicable  SCREENING: -Mammogram: Not applicable  - Colonoscopy: Not applicable  - Bone Density: Not applicable  -Hearing Test: Not applicable  -Spirometry: Not applicable   PATIENT COUNSELING:   Advised to take 1 mg of folate supplement per day if capable of pregnancy.   Sexuality: Discussed sexually transmitted diseases, partner selection, use of condoms, avoidance of unintended pregnancy  and contraceptive alternatives.   Advised to avoid cigarette smoking.  I discussed with the patient that most people either abstain from alcohol or drink within safe limits (<=14/week and <=4 drinks/occasion for males, <=7/weeks and <= 3 drinks/occasion for females) and that the risk for alcohol disorders and other health effects rises proportionally with the number of drinks per week and how often a drinker exceeds daily limits.  Discussed cessation/primary prevention of drug  use and availability of treatment for abuse.   Diet: Encouraged to adjust caloric intake to maintain  or achieve ideal body weight, to reduce intake of dietary saturated fat and total fat, to limit sodium intake by avoiding high sodium foods and not adding table salt, and to maintain adequate dietary potassium and calcium preferably from fresh fruits, vegetables, and low-fat dairy products.    stressed the importance of regular exercise  Injury prevention: Discussed safety belts, safety helmets, smoke detector, smoking near bedding or upholstery.   Dental health: Discussed importance of regular tooth brushing, flossing, and dental visits.    NEXT PREVENTATIVE PHYSICAL DUE IN 1  YEAR. Return in about 6 months (around 12/02/2022) for Depression.

## 2022-06-02 DIAGNOSIS — F3131 Bipolar disorder, current episode depressed, mild: Secondary | ICD-10-CM | POA: Diagnosis not present

## 2022-06-02 NOTE — Assessment & Plan Note (Signed)
Discussed contraception options and she would like to start the pill. Her menstrual period started today, she can start loestrin Fe today. Encouraged her to take this at the same time every day. Follow-up with any concerns.

## 2022-06-02 NOTE — Assessment & Plan Note (Signed)
Chronic, stable. She is currently following with psychiatry. She stopped the lexapro and Geodon, and is now getting an injectable medication, however she does not remember the name of it. Continue recommendations and collaboration with psychiatry.

## 2022-12-31 ENCOUNTER — Ambulatory Visit
Admission: EM | Admit: 2022-12-31 | Discharge: 2022-12-31 | Disposition: A | Discharge status: Home / Self Care | Payer: Medicaid Other | Attending: Urgent Care | Admitting: Urgent Care

## 2022-12-31 ENCOUNTER — Inpatient Hospital Stay: Admission: RE | Admit: 2022-12-31 | Payer: Self-pay | Source: Ambulatory Visit

## 2022-12-31 DIAGNOSIS — Z202 Contact with and (suspected) exposure to infections with a predominantly sexual mode of transmission: Secondary | ICD-10-CM | POA: Insufficient documentation

## 2022-12-31 DIAGNOSIS — Z113 Encounter for screening for infections with a predominantly sexual mode of transmission: Secondary | ICD-10-CM | POA: Insufficient documentation

## 2022-12-31 MED ORDER — DOXYCYCLINE HYCLATE 100 MG PO CAPS: 100.0000 mg | ORAL_CAPSULE | Freq: Two times a day (BID) | ORAL | 0 refills | Status: DC

## 2022-12-31 NOTE — ED Provider Notes (Signed)
[facility]  Note:  This document was prepared using Systems analyst and may include unintentional dictation errors.  MRN: [id number] DOB: [birth year]  Subjective:   Tammy Rosales is a 22 y.o. female presenting for STI testing.  Patient had unprotected sex with someone that turned out to be positive for chlamydia.  Patient does not want to do blood work for HIV and syphilis testing. Denies fever, n/v, abdominal pain, pelvic pain, rashes, dysuria, urinary frequency, hematuria, vaginal discharge.  No concern for pregnancy.  No current facility-administered medications for this encounter.  Current Outpatient Medications:    escitalopram (LEXAPRO) 10 MG tablet, Take 1 tablet (10 mg total) by mouth daily. (Patient not taking: Reported on 06/01/2022), Disp: 30 tablet, Rfl: 0   norethindrone-ethinyl estradiol-FE (LOESTRIN FE 1/20) 1-20 MG-MCG tablet, Take 1 tablet by mouth daily., Disp: 28 tablet, Rfl: 11   ziprasidone (GEODON) 20 MG capsule, Take 1 capsule (20 mg total) by mouth 2 (two) times daily with a meal. (Patient not taking: Reported on 06/01/2022), Disp: 60 capsule, Rfl: 0   No Known Allergies  Past Medical History:  Diagnosis Date   History of preterm labor 06/06/2021   SVD 03/2021 at 33 weeks   Medical history non-contributory      Past Surgical History:  Procedure Laterality Date   DILATION AND CURETTAGE OF UTERUS     NO PAST SURGERIES      Family History  Problem Relation Age of Onset   Hypertension Maternal Aunt     Social History   Tobacco Use   Smoking status: Never   Smokeless tobacco: Never  Vaping Use   Vaping Use: Never used  Substance Use Topics   Alcohol use: Not Currently   Drug use: Not Currently    ROS   Objective:   Vitals: BP 109/66 (BP Location: Right Arm)   Pulse 75   Temp 98.7 F (37.1 C) (Oral)   Resp 18   LMP 12/17/2022   SpO2 98%   Physical Exam Constitutional:      General: She is not in  acute distress.    Appearance: Normal appearance. She is well-developed. She is not ill-appearing, toxic-appearing or diaphoretic.  HENT:     Head: Normocephalic and atraumatic.     Nose: Nose normal.     Mouth/Throat:     Mouth: Mucous membranes are moist.  Eyes:     General: No scleral icterus.       Right eye: No discharge.        Left eye: No discharge.     Extraocular Movements: Extraocular movements intact.  Cardiovascular:     Rate and Rhythm: Normal rate.  Pulmonary:     Effort: Pulmonary effort is normal.  Skin:    General: Skin is warm and dry.  Neurological:     General: No focal deficit present.     Mental Status: She is alert and oriented to person, place, and time.  Psychiatric:        Mood and Affect: Mood normal.        Behavior: Behavior normal.     Assessment and Plan :   PDMP not reviewed this encounter.  1. Screen for STD (sexually transmitted disease)   2. Exposure to chlamydia     Patient treated empirically as per CDC guidelines with doxycycline as an outpatient.  Labs pending.   Counseled on safe sex practices including abstaining for 2 weeks following treatment.  Counseled patient  on potential for adverse effects with medications prescribed/recommended today, ER and return-to-clinic precautions discussed, patient verbalized understanding.    Jaynee Eagles, PA-C 12/31/22 1239

## 2022-12-31 NOTE — Discharge Instructions (Addendum)
Avoid all forms of sexual intercourse (oral, vaginal, anal) for the next 7 days to avoid spreading/reinfecting or at least until we can see what kinds of infection results are positive.  Abstaining for 2 weeks would be better but at least 1 week is required.  We will let you know about your test results from the swab we did today and if you need any prescriptions for antibiotics or changes to your treatment from today.  Retesting for checking to see if you cleared the infection is recommended after 4 weeks once you finish the antibiotics.

## 2022-12-31 NOTE — ED Triage Notes (Signed)
Pt requesting STD testing-denies sx-NAD-steady gait

## 2023-01-01 LAB — CERVICOVAGINAL ANCILLARY ONLY
Chlamydia: POSITIVE — AB
Comment: NEGATIVE
Comment: NEGATIVE
Comment: NORMAL
Neisseria Gonorrhea: POSITIVE — AB
Trichomonas: NEGATIVE

## 2023-01-02 ENCOUNTER — Telehealth (HOSPITAL_COMMUNITY): Payer: Self-pay | Admitting: Emergency Medicine

## 2023-01-02 NOTE — Telephone Encounter (Signed)
Patient treated at visit with Doxycycline. Per protocol, patient will need treatment with IM Rocephin '500mg'$  for positive Gonorrhea Results seen on mychart

## 2023-02-04 ENCOUNTER — Inpatient Hospital Stay: Admission: RE | Admit: 2023-02-04 | Payer: BC Managed Care – PPO | Source: Ambulatory Visit

## 2023-03-01 ENCOUNTER — Ambulatory Visit: Payer: Self-pay

## 2023-03-18 IMAGING — US US MFM OB DETAIL+14 WK
1 series · 13 of 28 positions shown · non-contrast
Comparison: none

[Series 1: us mfm ob detail+14 wk · 126 acquisitions, 13 frames shown]
[im 5/126]
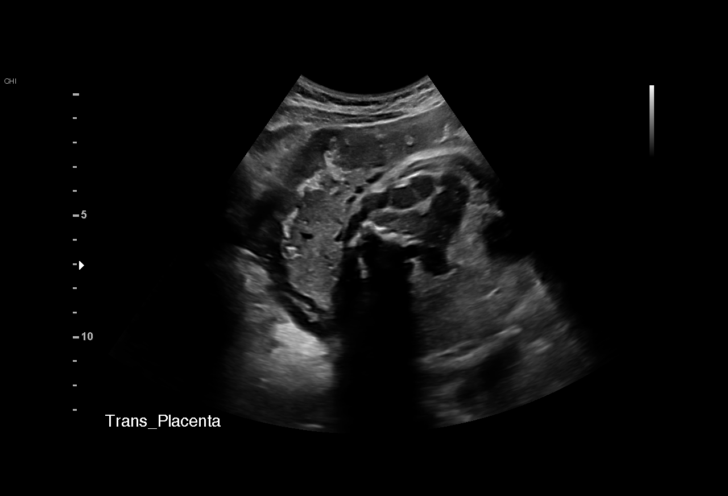
[im 14/126]
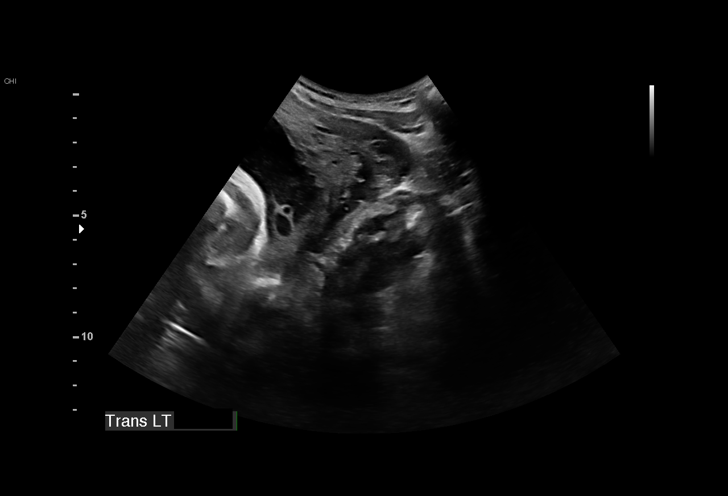
[im 24/126]
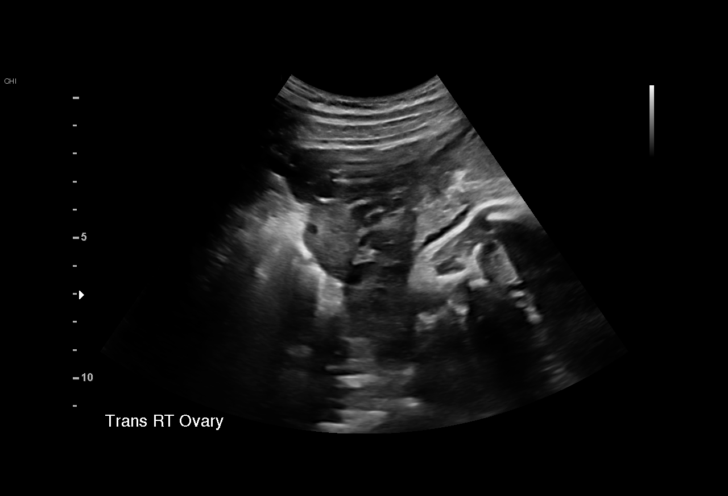
[im 33/126]
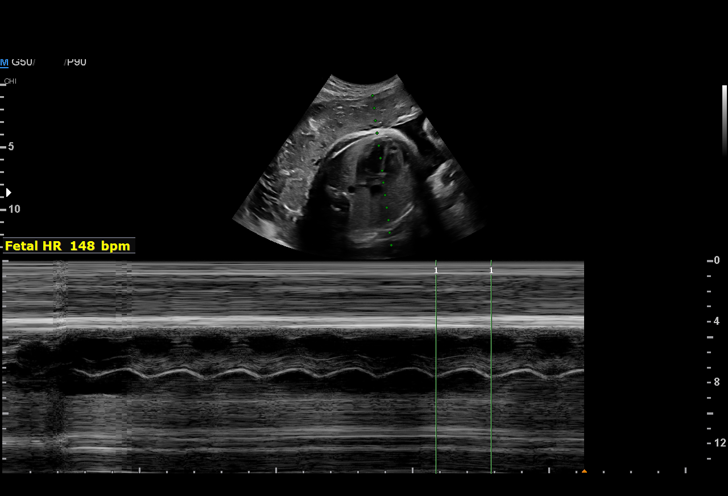
[im 42/126]
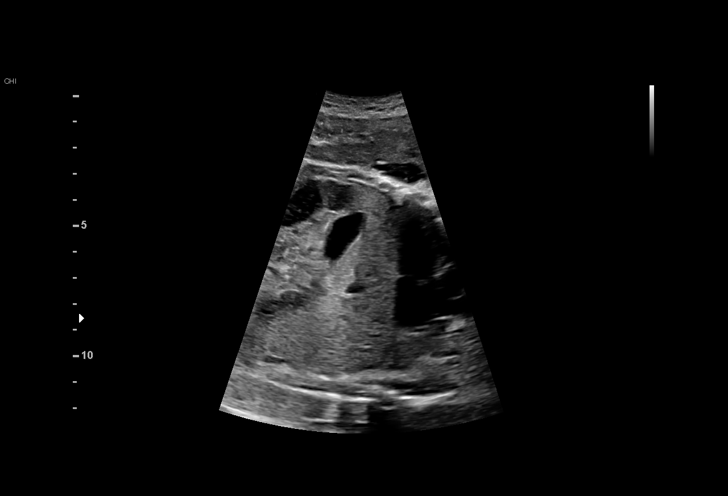
[im 51/126]
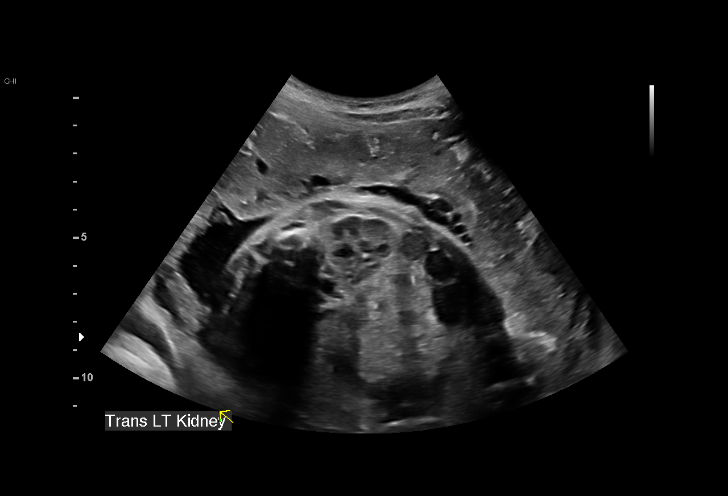
[im 65/126]
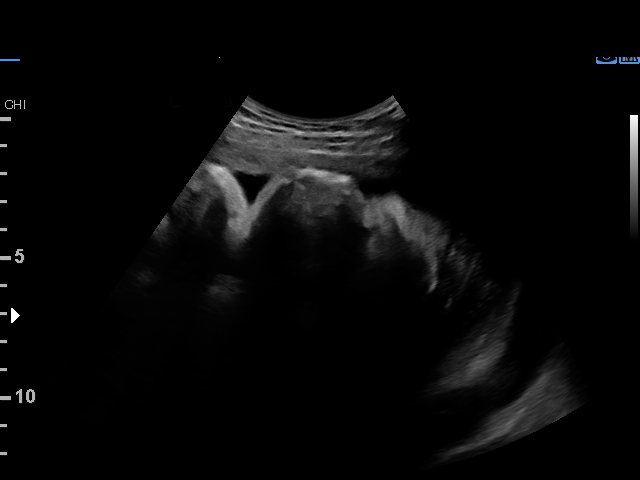
[im 75/126]
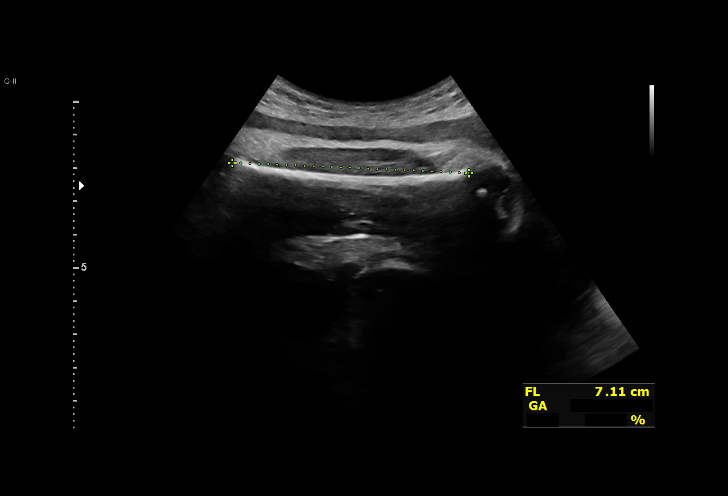
[im 84/126]
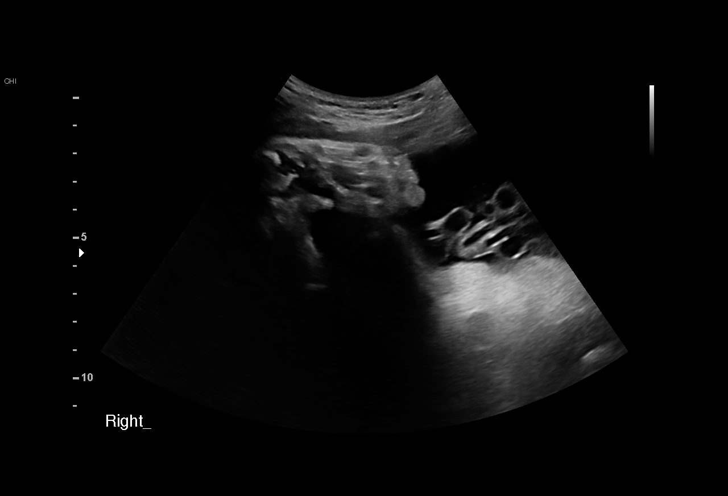
[im 93/126]
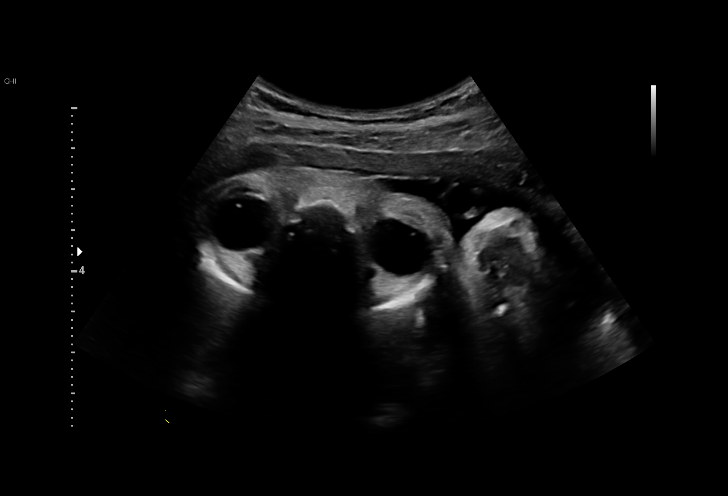
[im 102/126]
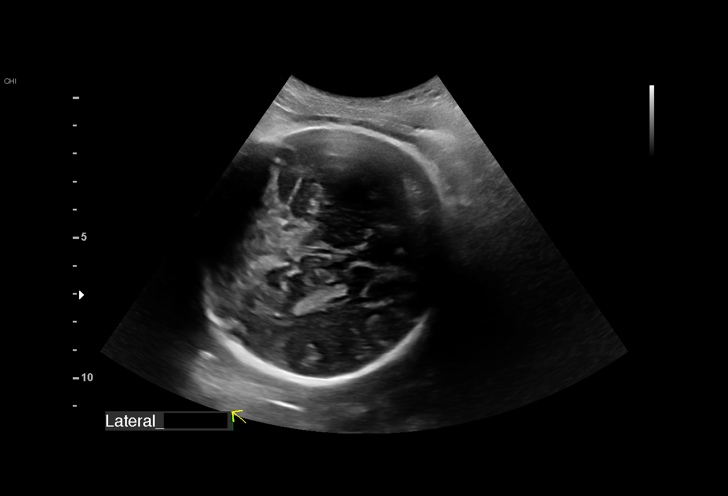
[im 112/126]
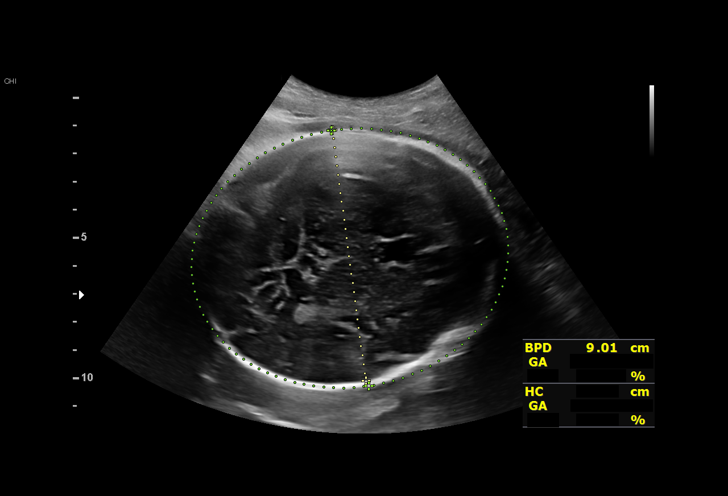
[im 121/126]
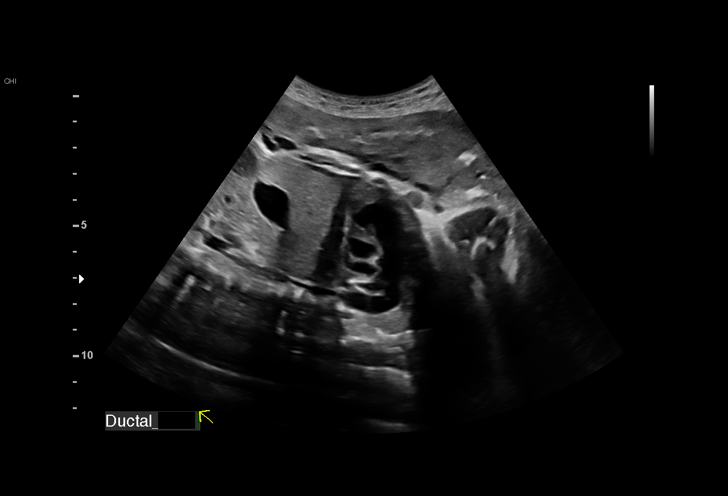

[13 of 28 positions shown; findings below may reference images not displayed]

Indications

 Late to prenatal care, third trimester
 Encounter for antenatal screening for
 malformations
 Hypertension - Gestational
 Teen pregnancy
 33 weeks gestation of pregnancy
Fetal Evaluation

 Num Of Fetuses:         1
 Fetal Heart Rate(bpm):  148
 Cardiac Activity:       Observed
 Presentation:           Cephalic
 Placenta:               Anterior
 P. Cord Insertion:      Not well visualized

 Amniotic Fluid
 AFI FV:      Within normal limits

 AFI Sum(cm)     %Tile       Largest Pocket(cm)
 14.61           52

 RUQ(cm)       RLQ(cm)       LUQ(cm)        LLQ(cm)

Biophysical Evaluation

 Amniotic F.V:   Pocket => 2 cm             F. Tone:        Observed
 F. Movement:    Observed                   Score:          [DATE]
 F. Breathing:   Observed
Biometry

 BPD:      89.6  mm     G. Age:  36w 2d         98  %    CI:        77.54   %    70 - 86
                                                         FL/HC:      22.0   %    19.9 -
 HC:      322.1  mm     G. Age:  36w 3d         86  %    HC/AC:      1.09        0.96 -
 AC:      296.1  mm     G. Age:  33w 4d         57  %    FL/BPD:     79.2   %    71 - 87
 FL:         71  mm     G. Age:  36w 3d         96  %    FL/AC:      24.0   %    20 - 24
 HUM:        62  mm     G. Age:  35w 6d       > 95  %
 LV:        8.3  mm

 Est. FW:    4114  gm    5 lb 10 oz      85  %
OB History

 Gravidity:    1
Gestational Age

 LMP:           33w 3d        Date:  08/30/20                 EDD:   06/06/21
 U/S Today:     35w 5d                                        EDD:   05/21/21
 Best:          33w 3d     Det. By:  LMP  (08/30/20)          EDD:   06/06/21
Anatomy

 Cranium:               Appears normal         LVOT:                   Appears normal
 Cavum:                 Appears normal         Aortic Arch:            Not well visualized
 Ventricles:            Appears normal         Ductal Arch:            Appears normal
 Choroid Plexus:        Appears normal         Diaphragm:              Appears normal
 Cerebellum:            Not well visualized    Stomach:                Appears normal, left
                                                                       sided
 Posterior Fossa:       Not well visualized    Abdomen:                Appears normal
 Nuchal Fold:           Not applicable (>20    Abdominal Wall:         Not well visualized
                        wks GA)
 Face:                  Appears normal         Cord Vessels:           Appears normal (3
                        (orbits and profile)                           vessel cord)
 Lips:                  Appears normal         Kidneys:                Appear normal
 Palate:                Not well visualized    Bladder:                Appears normal
 Thoracic:              Appears normal         Spine:                  Not well visualized
 Heart:                 Appears normal         Upper Extremities:      left upper ext
                        (4CH, axis, and                                visualized
                        situs)
 RVOT:                  Not well visualized    Lower Extremities:      Femurs and tib/fibs
                                                                       visualized

 Other:  Fetus appears to be female. Nasal bone visualized. Lenses
         visualized. Technically difficult due to advanced gestational age.
Cervix Uterus Adnexa
 Cervix
 Not visualized (advanced GA >38wks)

 Uterus
 No abnormality visualized.

 Right Ovary
 Within normal limits.

 Left Ovary
 Within normal limits.

 Cul De Sac
 Not well visualized

 Adnexa
 No abnormality visualized.
Impression

 G1 P0.  Late prenatal care.
 Patient has a diagnosis of gestational hypertension based on
 evaluation at the [facility] 2 days ago.  She had no prenatal care
 so far.  Patient is very sure of her LMP date.
 Subsequent office visit blood pressures were within normal
 range.  Blood pressure today at her office is 126/69 mmHg.
 She has screening for gestational diabetes today.
 Apparently, blood was drawn for cell-free fetal DNA screening.
 On ultrasound, amniotic fluid is normal and good fetal activity
 seen.  Fetal biometry is 2 weeks ahead of her gestational age
 established by LMP.  Fetal anatomical survey appears
 normal but limited by advanced gestational age.  Antenatal
 testing is reassuring.  BPP [DATE].
 Since gestational age by ultrasound differs from LMP-
 established date by less than 3 weeks, we have not amended
 her EDD.
 I discussed gestational hypertension/preeclampsia and
 recommended weekly antenatal testing.
Recommendations

 -Appointments were made for weekly BPP.
 -Fetal growth assessment in 3 weeks.
 -If gestational hypertension is confirmed, I recommend
 delivery at 37 weeks gestation.
                 Capobianco, Yohan

## 2023-08-18 ENCOUNTER — Inpatient Hospital Stay
Admission: RE | Admit: 2023-08-18 | Discharge: 2023-08-18 | Discharge status: Home / Self Care | Payer: Self-pay | Source: Ambulatory Visit | Attending: Internal Medicine

## 2023-08-18 VITALS — BP 106/70 | HR 67 | Ht 64.0 in | Wt 140.0 lb

## 2023-08-18 DIAGNOSIS — N898 Other specified noninflammatory disorders of vagina: Secondary | ICD-10-CM | POA: Diagnosis present

## 2023-08-18 DIAGNOSIS — R82998 Other abnormal findings in urine: Secondary | ICD-10-CM | POA: Diagnosis present

## 2023-08-18 DIAGNOSIS — Z113 Encounter for screening for infections with a predominantly sexual mode of transmission: Secondary | ICD-10-CM | POA: Insufficient documentation

## 2023-08-18 LAB — POCT URINALYSIS DIP (MANUAL ENTRY)
Bilirubin, UA: NEGATIVE
Blood, UA: NEGATIVE
Glucose, UA: NEGATIVE mg/dL
Ketones, POC UA: NEGATIVE mg/dL
Nitrite, UA: NEGATIVE
Protein Ur, POC: NEGATIVE mg/dL
Spec Grav, UA: 1.025 (ref 1.010–1.025)
Urobilinogen, UA: 1 U/dL
pH, UA: 6.5 (ref 5.0–8.0)

## 2023-08-18 LAB — POCT URINE PREGNANCY: Preg Test, Ur: NEGATIVE

## 2023-08-18 NOTE — ED Provider Notes (Signed)
[facility]    CSN: [id number] Arrival date & time: 08/18/23  1029      History   Chief Complaint Chief Complaint  Patient presents with   SEXUALLY TRANSMITTED DISEASE    Pep Smear? Checking for any STDS - Entered by patient    HPI Tammy Rosales is a 22 y.o. female presents for evaluation of vaginal odor.  Patient reports 3 weeks of a vaginal odor without increasing vaginal discharge or vaginal itching.  No dysuria, fevers, nausea/vomiting, flank pain.  Denies any STD exposure but she would like screening while here.  Denies history of BV or yeast.  No OTC medications have been used since onset.  No other concerns at this time.  HPI  Past Medical History:  Diagnosis Date   History of preterm labor 06/06/2021   SVD 03/2021 at 33 weeks   Medical history non-contributory     Patient Active Problem List   Diagnosis Date Noted   MDD (major depressive disorder), single episode, severe with psychotic features (HCC) 12/27/2021   Vitamin D deficiency 12/26/2021   Adjustment disorder with mixed anxiety and depressed mood 12/22/2021   Anxiety and depression 12/07/2021   Birth control counseling 12/07/2021    Past Surgical History:  Procedure Laterality Date   DILATION AND CURETTAGE OF UTERUS     NO PAST SURGERIES      OB History     Gravida  2   Para  1   Term  1   Preterm  0   AB  0   Living  1      SAB  0   IAB  0   Ectopic  0   Multiple      Live Births  1            Home Medications    Prior to Admission medications   Medication Sig Start Date End Date Taking? Authorizing Provider  doxycycline (VIBRAMYCIN) 100 MG capsule Take 1 capsule (100 mg total) by mouth 2 (two) times daily. 12/31/22   Wallis Bamberg, PA-C  escitalopram (LEXAPRO) 10 MG tablet Take 1 tablet (10 mg total) by mouth daily. Patient not taking: Reported on 06/01/2022 04/17/22   Ardis Hughs, NP  norethindrone-ethinyl estradiol-FE (LOESTRIN FE 1/20) 1-20 MG-MCG tablet  Take 1 tablet by mouth daily. 06/01/22   McElwee, Jake Church, NP  ziprasidone (GEODON) 20 MG capsule Take 1 capsule (20 mg total) by mouth 2 (two) times daily with a meal. Patient not taking: Reported on 06/01/2022 04/17/22   Ardis Hughs, NP    Family History Family History  Problem Relation Age of Onset   Hypertension Maternal Aunt     Social History Social History   Tobacco Use   Smoking status: Never   Smokeless tobacco: Never  Vaping Use   Vaping status: Never Used  Substance Use Topics   Alcohol use: Not Currently   Drug use: Not Currently     Allergies   Patient has no known allergies.   Review of Systems Review of Systems  Genitourinary:        Vaginal odor/STD screening     Physical Exam Triage Vital Signs ED Triage Vitals  Encounter Vitals Group     BP 08/18/23 1043 106/70     Systolic BP Percentile --      Diastolic BP Percentile --      Pulse Rate 08/18/23 1043 67     Resp --  Temp --      Temp src --      SpO2 08/18/23 1043 98 %     Weight 08/18/23 1040 140 lb (63.5 kg)     Height 08/18/23 1040 5\' 4"  (1.626 m)     Head Circumference --      Peak Flow --      Pain Score --      Pain Loc --      Pain Education --      Exclude from Growth Chart --    No data found.  Updated Vital Signs BP 106/70 (BP Location: Left Arm)   Pulse 67   Ht 5\' 4"  (1.626 m)   Wt 140 lb (63.5 kg)   LMP 08/12/2023 (Exact Date)   SpO2 98%   BMI 24.03 kg/m   Visual Acuity Right Eye Distance:   Left Eye Distance:   Bilateral Distance:    Right Eye Near:   Left Eye Near:    Bilateral Near:     Physical Exam Vitals and nursing note reviewed.  Constitutional:      General: She is not in acute distress.    Appearance: Normal appearance. She is not ill-appearing.  HENT:     Head: Normocephalic and atraumatic.  Eyes:     Pupils: Pupils are equal, round, and reactive to light.  Cardiovascular:     Rate and Rhythm: Normal rate.  Pulmonary:     Effort:  Pulmonary effort is normal.  Abdominal:     Tenderness: There is no right CVA tenderness or left CVA tenderness.  Skin:    General: Skin is warm and dry.  Neurological:     General: No focal deficit present.     Mental Status: She is alert and oriented to person, place, and time.  Psychiatric:        Mood and Affect: Mood normal.        Behavior: Behavior normal.      UC Treatments / Results  Labs (all labs ordered are listed, but only abnormal results are displayed) Labs Reviewed  POCT URINALYSIS DIP (MANUAL ENTRY) - Abnormal; Notable for the following components:      Result Value   Clarity, UA hazy (*)    Leukocytes, UA Trace (*)    All other components within normal limits  URINE CULTURE  RPR  HIV ANTIBODY (ROUTINE TESTING W REFLEX)  POCT URINE PREGNANCY  CERVICOVAGINAL ANCILLARY ONLY    EKG   Radiology No results found.  Procedures Procedures (including critical care time)  Medications Ordered in UC Medications - No data to display  Initial Impression / Assessment and Plan / UC Course  I have reviewed the triage vital signs and the nursing notes.  Pertinent labs & imaging results that were available during my care of the patient were reviewed by me and considered in my medical decision making (see chart for details).     Reviewed exam and symptoms with patient.  No red flags.  UA with trace leuks but patient is asymptomatic, will culture.  Vaginal swab/STD testing is ordered and will contact for any positive results.  Will await results prior to initiating any treatment.  PCP or GYN follow-up if symptoms do not improve.  ER precautions reviewed. Final Clinical Impressions(s) / UC Diagnoses   Final diagnoses:  Vaginal odor  Screening examination for STD (sexually transmitted disease)  Leukocytes in urine     Discharge Instructions      The clinic will  contact you with results of the testing done today if positive.  Please follow-up with your PCP or  GYN if symptoms do not improve.  Please go to the ER for any worsening symptoms.  I hope you feel better soon!     ED Prescriptions   None    PDMP not reviewed this encounter.   Radford Pax, NP 08/18/23 1121

## 2023-08-18 NOTE — ED Triage Notes (Signed)
Patient states she is her for STI testing, reports an odor for 3 weeks.

## 2023-08-18 NOTE — Discharge Instructions (Signed)
The clinic will contact you with results of the testing done today if positive.  Please follow-up with your PCP or GYN if symptoms do not improve.  Please go to the ER for any worsening symptoms.  I hope you feel better soon!

## 2023-08-19 LAB — HIV ANTIBODY (ROUTINE TESTING W REFLEX): HIV Screen 4th Generation wRfx: NONREACTIVE

## 2023-08-19 LAB — RPR: RPR Ser Ql: NONREACTIVE

## 2023-08-20 ENCOUNTER — Telehealth: Payer: Self-pay

## 2023-08-20 LAB — URINE CULTURE: Culture: NO GROWTH

## 2023-08-20 LAB — CERVICOVAGINAL ANCILLARY ONLY
Bacterial Vaginitis (gardnerella): POSITIVE — AB
Candida Glabrata: NEGATIVE
Candida Vaginitis: NEGATIVE
Chlamydia: POSITIVE — AB
Comment: NEGATIVE
Comment: NEGATIVE
Comment: NEGATIVE
Comment: NEGATIVE
Comment: NEGATIVE
Comment: NORMAL
Neisseria Gonorrhea: NEGATIVE
Trichomonas: NEGATIVE

## 2023-08-20 MED ORDER — DOXYCYCLINE HYCLATE 100 MG PO CAPS: 100.0000 mg | ORAL_CAPSULE | Freq: Two times a day (BID) | ORAL | 0 refills | Status: DC

## 2023-08-20 MED ORDER — METRONIDAZOLE 500 MG PO TABS: 500.0000 mg | ORAL_TABLET | Freq: Two times a day (BID) | ORAL | 0 refills | Status: DC

## 2023-08-20 NOTE — Telephone Encounter (Signed)
Contacted patient by phone.  Verified identity using two identifiers.  Provided positive result.  Reviewed safe sex practices, notifying partners, and refraining from sexual activities for 7 days from time of treatment.  Patient verified understanding, all questions answered.    Pt requires tx with Doxycycline and Metronidazole.  Reviewed with patient, verified pharmacy, prescription sent.

## 2023-11-08 ENCOUNTER — Ambulatory Visit: Payer: Self-pay

## 2024-05-05 ENCOUNTER — Telehealth: Payer: Self-pay

## 2024-05-05 NOTE — Telephone Encounter (Signed)
 Called pt.

## 2024-05-13 ENCOUNTER — Ambulatory Visit: Admitting: Family Medicine

## 2024-05-18 ENCOUNTER — Inpatient Hospital Stay (HOSPITAL_COMMUNITY): Admission: RE | Admit: 2024-05-18 | Source: Home / Self Care | Admitting: Family Medicine

## 2024-05-28 ENCOUNTER — Encounter: Payer: Self-pay | Admitting: Nurse Practitioner

## 2024-05-28 ENCOUNTER — Ambulatory Visit: Admitting: Nurse Practitioner

## 2024-05-28 ENCOUNTER — Other Ambulatory Visit (HOSPITAL_COMMUNITY)
Admission: RE | Admit: 2024-05-28 | Discharge: 2024-05-28 | Disposition: A | Discharge status: Home / Self Care | Source: Ambulatory Visit | Attending: Nurse Practitioner | Admitting: Nurse Practitioner

## 2024-05-28 VITALS — BP 118/64 | HR 114 | Temp 97.6°F | Ht 64.0 in | Wt 184.8 lb

## 2024-05-28 DIAGNOSIS — F323 Major depressive disorder, single episode, severe with psychotic features: Secondary | ICD-10-CM | POA: Diagnosis not present

## 2024-05-28 DIAGNOSIS — Z3A35 35 weeks gestation of pregnancy: Secondary | ICD-10-CM | POA: Diagnosis not present

## 2024-05-28 DIAGNOSIS — R8290 Unspecified abnormal findings in urine: Secondary | ICD-10-CM

## 2024-05-28 DIAGNOSIS — Z3490 Encounter for supervision of normal pregnancy, unspecified, unspecified trimester: Secondary | ICD-10-CM | POA: Insufficient documentation

## 2024-05-28 DIAGNOSIS — Z131 Encounter for screening for diabetes mellitus: Secondary | ICD-10-CM | POA: Diagnosis not present

## 2024-05-28 DIAGNOSIS — Z113 Encounter for screening for infections with a predominantly sexual mode of transmission: Secondary | ICD-10-CM

## 2024-05-28 LAB — POCT URINALYSIS DIPSTICK
Bilirubin, UA: NEGATIVE
Glucose, UA: NEGATIVE
Ketones, UA: NEGATIVE
Nitrite, UA: NEGATIVE
Protein, UA: NEGATIVE
Spec Grav, UA: 1.015 (ref 1.010–1.025)
Urobilinogen, UA: 1 U/dL
pH, UA: 8 (ref 5.0–8.0)

## 2024-05-28 NOTE — Assessment & Plan Note (Signed)
 Chronic, ongoing. She states that her mood is better this pregnancy than it was last pregnancy. She is not currently on Lexapro , which was previously prescribed and is safe during pregnancy. Discuss the option to restart Lexapro  if mood symptoms worsen. Advise against using Geodon  during pregnancy.

## 2024-05-28 NOTE — Assessment & Plan Note (Signed)
 She is measuring [redacted] weeks pregnant. She has not had any prenatal care. Blood pressure is controlled. Refer to OB/GYN for comprehensive prenatal care and evaluation. Recommend switching from multivitamin to prenatal vitamin. Seek immediate care for vaginal bleeding, decreased fetal movement, or contractions. Check CMP, CBC, A1c, U/A, gonorrhea, chlamydia, HIV, hepatitis C, hepatitis B, and blood type.

## 2024-05-28 NOTE — Progress Notes (Signed)
 Established Patient Office Visit  Subjective   Patient ID: Tammy Rosales, female    DOB: 2001/03/10  Age: 23 y.o. MRN: [id number]  No chief complaint on file.   HPI Discussed the use of AI scribe software for clinical note transcription with the patient, who gave verbal consent to proceed.  History of Present Illness   Tammy Rosales is a 23 year old female who presents with a positive pregnancy test and symptoms related to pregnancy.  Her last menstrual period was in November, and she has not received any prenatal care in the past eight months. She experiences frequent urination and feels the baby move frequently. Initial significant nausea led to cessation of work, but it has resolved. She experiences daily cramping, especially with movement or position changes, and swelling in her feet and legs. She denies chest pain or shortness of breath but feels tired when moving from sitting to standing.  She maintains a balanced diet with regular intake of fruits and vegetables and takes a multivitamin daily. She discontinued Lexapro  and Geodon  upon learning of her pregnancy. She denies alcohol and tobacco use. A milky discharge has been present since June, occurring frequently with urination. She experiences some constipation but is able to defecate.  Her mood is described as 'terrible' but better than during her previous pregnancy. There are no crying spells. She has not been using birth control and describes the pregnancy as unexpected.       ROS See pertinent positives and negatives per HPI.    Objective:     BP 118/64 (BP Location: Right Arm, Patient Position: Sitting, Cuff Size: Normal)   Pulse (!) 114   Temp 97.6 F (36.4 C)   Ht 5' 4 (1.626 m)   Wt 184 lb 12.8 oz (83.8 kg)   SpO2 97%   BMI 31.72 kg/m    Physical Exam Vitals and nursing note reviewed.  Constitutional:      General: She is not in acute distress.    Appearance: Normal appearance.  HENT:      Head: Normocephalic.  Eyes:     Conjunctiva/sclera: Conjunctivae normal.  Cardiovascular:     Rate and Rhythm: Normal rate and regular rhythm.     Pulses: Normal pulses.     Heart sounds: Normal heart sounds.  Pulmonary:     Effort: Pulmonary effort is normal.     Breath sounds: Normal breath sounds.  Abdominal:     Comments: Fundus measuring 35cm  Musculoskeletal:     Cervical back: Normal range of motion.     Right lower leg: Edema (non-pitting) present.     Left lower leg: Edema (non-pitting) present.  Skin:    General: Skin is warm.  Neurological:     General: No focal deficit present.     Mental Status: She is alert and oriented to person, place, and time.  Psychiatric:        Mood and Affect: Mood normal.        Behavior: Behavior normal.        Thought Content: Thought content normal.        Judgment: Judgment normal.      Assessment & Plan:   Problem List Items Addressed This Visit       Other   MDD (major depressive disorder), single episode, severe with psychotic features (HCC)   Chronic, ongoing. She states that her mood is better this pregnancy than it was last pregnancy. She is not currently on Lexapro , which  was previously prescribed and is safe during pregnancy. Discuss the option to restart Lexapro  if mood symptoms worsen. Advise against using Geodon  during pregnancy.       Pregnancy - Primary   She is measuring [redacted] weeks pregnant. She has not had any prenatal care. Blood pressure is controlled. Refer to OB/GYN for comprehensive prenatal care and evaluation. Recommend switching from multivitamin to prenatal vitamin. Seek immediate care for vaginal bleeding, decreased fetal movement, or contractions. Check CMP, CBC, A1c, U/A, gonorrhea, chlamydia, HIV, hepatitis C, hepatitis B, and blood type.       Relevant Orders   Ambulatory referral to Obstetrics / Gynecology   CBC with Differential/Platelet   Comprehensive metabolic panel with GFR   Hemoglobin A1c    Hepatitis B core antibody, total   Hepatitis B surface antibody,qualitative   Hepatitis B surface antigen   Hepatitis C antibody   HIV Antibody (routine testing w rflx)   ABO/Rh   Urine cytology ancillary only   POCT urinalysis dipstick (Completed)   Other Visit Diagnoses       Abnormal urine       U/A shows 3+ leukocytes. Will add on urine culture.   Relevant Orders   Urine Culture     Screen for STD (sexually transmitted disease)       Check STD today.   Relevant Orders   Hepatitis B core antibody, total   Hepatitis B surface antibody,qualitative   Hepatitis B surface antigen   Hepatitis C antibody   HIV Antibody (routine testing w rflx)     Screening for diabetes mellitus       Check A1c today   Relevant Orders   Hemoglobin A1c       Return if symptoms worsen or fail to improve.   A total of 30 minutes were spent on this encounter today. When total time is documented, this includes both the face-to-face and non-face-to-face time personally spent before, during and after the visit on the date of the encounter. We specifically discussed signs to watch for with pregnancy, prenatal vitamin, and referral to OB. Discussed with supervising MD labs to order.   Tinnie DELENA Harada, NP

## 2024-05-28 NOTE — Patient Instructions (Signed)
 It was great to see you!  I have placed a referral to OB/GYN please let me know if you don't hear from them in the next few days   Keep taking a prenatal vitamin daily   Let's follow-up with any concerns  Take care,  Tinnie Harada, NP

## 2024-05-29 ENCOUNTER — Telehealth: Payer: Self-pay

## 2024-05-29 ENCOUNTER — Ambulatory Visit: Payer: Self-pay | Admitting: Nurse Practitioner

## 2024-05-29 LAB — CBC WITH DIFFERENTIAL/PLATELET
Basophils Absolute: 0 K/uL (ref 0.0–0.1)
Basophils Relative: 0.4 % (ref 0.0–3.0)
Eosinophils Absolute: 0 K/uL (ref 0.0–0.7)
Eosinophils Relative: 0.8 % (ref 0.0–5.0)
HCT: 23.3 % — CL (ref 36.0–46.0)
Hemoglobin: 6.8 g/dL — CL (ref 12.0–15.0)
Lymphocytes Relative: 26.2 % (ref 12.0–46.0)
Lymphs Abs: 1.7 K/uL (ref 0.7–4.0)
MCHC: 29.4 g/dL — ABNORMAL LOW (ref 30.0–36.0)
MCV: 57.3 fl — ABNORMAL LOW (ref 78.0–100.0)
Monocytes Absolute: 0.5 K/uL (ref 0.1–1.0)
Monocytes Relative: 7.7 % (ref 3.0–12.0)
Neutro Abs: 4.2 K/uL (ref 1.4–7.7)
Neutrophils Relative %: 64.9 % (ref 43.0–77.0)
Platelets: 243 K/uL (ref 150.0–400.0)
RBC: 4.07 Mil/uL (ref 3.87–5.11)
RDW: 23 % — ABNORMAL HIGH (ref 11.5–15.5)
WBC: 6.5 K/uL (ref 4.0–10.5)

## 2024-05-29 LAB — URINE CYTOLOGY ANCILLARY ONLY
Chlamydia: POSITIVE — AB
Comment: NEGATIVE
Comment: NORMAL
Neisseria Gonorrhea: NEGATIVE

## 2024-05-29 LAB — COMPREHENSIVE METABOLIC PANEL WITH GFR
ALT: 6 U/L (ref 0–35)
AST: 13 U/L (ref 0–37)
Albumin: 3.3 g/dL — ABNORMAL LOW (ref 3.5–5.2)
Alkaline Phosphatase: 127 U/L — ABNORMAL HIGH (ref 39–117)
BUN: 4 mg/dL — ABNORMAL LOW (ref 6–23)
CO2: 24 meq/L (ref 19–32)
Calcium: 8.6 mg/dL (ref 8.4–10.5)
Chloride: 104 meq/L (ref 96–112)
Creatinine, Ser: 0.48 mg/dL (ref 0.40–1.20)
GFR: 133.9 mL/min (ref >60.00)
Glucose, Bld: 97 mg/dL (ref 70–99)
Potassium: 3.8 meq/L (ref 3.5–5.1)
Sodium: 136 meq/L (ref 135–145)
Total Bilirubin: 0.3 mg/dL (ref 0.2–1.2)
Total Protein: 6.8 g/dL (ref 6.0–8.3)

## 2024-05-29 LAB — HIV ANTIBODY (ROUTINE TESTING W REFLEX): HIV 1&2 Ab, 4th Generation: NONREACTIVE

## 2024-05-29 LAB — HEPATITIS B SURFACE ANTIGEN: Hepatitis B Surface Ag: NONREACTIVE

## 2024-05-29 LAB — HEPATITIS C ANTIBODY: Hepatitis C Ab: NONREACTIVE

## 2024-05-29 LAB — ABO/RH: Rh Factor: POSITIVE

## 2024-05-29 LAB — HEMOGLOBIN A1C: Hgb A1c MFr Bld: 5.4 % (ref 4.6–6.5)

## 2024-05-29 LAB — HEPATITIS B CORE ANTIBODY, TOTAL: Hep B Core Total Ab: NONREACTIVE

## 2024-05-29 LAB — HEPATITIS B SURFACE ANTIBODY,QUALITATIVE: Hep B S Ab: NONREACTIVE

## 2024-05-29 NOTE — Telephone Encounter (Signed)
 Sent text to pts mothers cell # to have pt call the office urgently for lab results.

## 2024-05-29 NOTE — Telephone Encounter (Signed)
 Also left VM to rtn call on other phone numbers. Dm/cma

## 2024-05-29 NOTE — Telephone Encounter (Signed)
 Patient reviewed that labs online.  Was unable to speak to patient. Dm/cma

## 2024-05-29 NOTE — Telephone Encounter (Signed)
 Received a call from lab with critical lab values:  Hemoglobin 6.8 Hematocrit  23.3  Give verbally to provider and then message sent to advise.  Dm/cma

## 2024-05-29 NOTE — Telephone Encounter (Signed)
 Called cell number and left VM to Rtn call. Dm/cma

## 2024-05-30 LAB — URINE CULTURE
MICRO NUMBER:: 16765049
SPECIMEN QUALITY:: ADEQUATE

## 2024-06-02 MED ORDER — AMOXICILLIN-POT CLAVULANATE 875-125 MG PO TABS: 1.0000 | ORAL_TABLET | Freq: Two times a day (BID) | ORAL | 0 refills | Status: AC

## 2024-06-02 MED ORDER — AZITHROMYCIN 500 MG PO TABS: 1000.0000 mg | ORAL_TABLET | Freq: Once | ORAL | 0 refills | Status: AC

## 2024-06-15 LAB — PANORAMA PRENATAL TEST FULL PANEL:PANORAMA TEST PLUS 5 ADDITIONAL MICRODELETIONS: FETAL FRACTION: 16.9

## 2024-08-05 ENCOUNTER — Ambulatory Visit: Admitting: Nurse Practitioner

## 2024-08-08 ENCOUNTER — Ambulatory Visit: Admitting: Nurse Practitioner

## 2024-08-26 ENCOUNTER — Telehealth: Payer: Self-pay | Admitting: Nurse Practitioner

## 2024-08-26 ENCOUNTER — Encounter: Payer: Self-pay | Admitting: Nurse Practitioner

## 2024-08-26 NOTE — Telephone Encounter (Signed)
 08/05/2024 same day cancellation & 08/08/2024 no show

## 2024-08-26 NOTE — Telephone Encounter (Signed)
 Noted
